# Patient Record
Sex: Female | Born: 1980
Health system: Southern US, Community
[De-identification: ages and names within clinical notes are randomized; demographics above are authoritative.]

## PROBLEM LIST (undated history)

## (undated) DIAGNOSIS — D649 Anemia, unspecified: Secondary | ICD-10-CM

## (undated) HISTORY — PX: CAROTID BODY TUMOR EXCISION: SHX5156

---

## 2011-07-19 DIAGNOSIS — Z789 Other specified health status: Secondary | ICD-10-CM | POA: Insufficient documentation

## 2011-07-27 DIAGNOSIS — E559 Vitamin D deficiency, unspecified: Secondary | ICD-10-CM | POA: Insufficient documentation

## 2014-10-14 DIAGNOSIS — Z2821 Immunization not carried out because of patient refusal: Secondary | ICD-10-CM | POA: Insufficient documentation

## 2015-08-31 DIAGNOSIS — H61102 Unspecified noninfective disorders of pinna, left ear: Secondary | ICD-10-CM | POA: Insufficient documentation

## 2015-09-11 DIAGNOSIS — D110 Benign neoplasm of parotid gland: Secondary | ICD-10-CM | POA: Insufficient documentation

## 2016-02-29 ENCOUNTER — Ambulatory Visit (INDEPENDENT_AMBULATORY_CARE_PROVIDER_SITE_OTHER): Payer: 59 | Admitting: Family Medicine

## 2016-02-29 ENCOUNTER — Encounter: Payer: Self-pay | Admitting: Family Medicine

## 2016-02-29 DIAGNOSIS — Z3689 Encounter for other specified antenatal screening: Secondary | ICD-10-CM | POA: Diagnosis not present

## 2016-02-29 DIAGNOSIS — Z113 Encounter for screening for infections with a predominantly sexual mode of transmission: Secondary | ICD-10-CM | POA: Diagnosis not present

## 2016-02-29 DIAGNOSIS — Z3481 Encounter for supervision of other normal pregnancy, first trimester: Secondary | ICD-10-CM | POA: Diagnosis not present

## 2016-02-29 DIAGNOSIS — Z3480 Encounter for supervision of other normal pregnancy, unspecified trimester: Secondary | ICD-10-CM | POA: Insufficient documentation

## 2016-02-29 NOTE — Progress Notes (Signed)
  Subjective:    Christine Bryant is a [id number] [redacted]w[redacted]d being seen today for her first obstetrical visit.  Her obstetrical history is insignificant. Patient does intend to breast feed. Pregnancy history fully reviewed.  Spontaneous labors with both pregnancies. Proven pelvis to 10#1oz. Minimal tear, no shoulder dystocia. No history of GDM, GHTN, preeclampsia, PP hemorrage.  Patient reports no complaints.  Vitals:   02/29/16 0911 02/29/16 0915  BP: 127/77   Pulse: 75   Weight: 212 lb (96.2 kg)   Height:  5\' 10"  (1.778 m)    HISTORY: OB History  Gravida Para Term Preterm AB Living  3 2 2     2   SAB TAB Ectopic Multiple Live Births          2    # Outcome Date GA Lbr Len/2nd Weight Sex Delivery Anes PTL Lv  3 Current           2 Term 12/19/14 [redacted]w[redacted]d  10 lb 1 oz (4.564 kg) F Vag-Spont  N   1 Term 07/12/13 [redacted]w[redacted]d  8 lb 15 oz (4.054 kg) F Vag-Spont  Y      No past medical history on file. No past surgical history on file. No family history on file.   Exam    Uterus:     Pelvic Exam:    Perineum: No Hemorrhoids, Normal Perineum   Vulva: Bartholin's, Urethra, Skene's normal   Vagina:  normal mucosa   Cervix: multiparous appearance   Adnexa: normal adnexa and no mass, fullness, tenderness   Bony Pelvis: gynecoid  System:     Skin: normal coloration and turgor, no rashes    Neurologic: gait normal; reflexes normal and symmetric   Extremities: normal strength, tone, and muscle mass   HEENT PERRLA and extra ocular movement intact   Mouth/Teeth mucous membranes moist, pharynx normal without lesions   Neck supple and no masses   Cardiovascular: regular rate and rhythm, no murmurs or gallops   Respiratory:  appears well, vitals normal, no respiratory distress, acyanotic, normal RR, ear and throat exam is normal, neck free of mass or lymphadenopathy, chest clear, no wheezing, crepitations, rhonchi, normal symmetric air entry   Abdomen: soft, non-tender; bowel sounds normal; no masses,   no organomegaly   Urinary: urethral meatus normal      Assessment:    Pregnancy: [id number] Patient Active Problem List   Diagnosis Date Noted  . Supervision of other normal pregnancy, antepartum 02/29/2016        Plan:     Initial labs drawn. Prenatal vitamins. Problem list reviewed and updated. Genetic Screening discussed - declined.  Ultrasound discussed; fetal survey: requested.  Follow up in 4 weeks. 50% of 30 min visit spent on counseling and coordination of care.     Loma Boston JEHIEL 02/29/2016

## 2016-02-29 NOTE — Progress Notes (Signed)
Bedside ultrasound reveal 8-2 weeks singleton. CRL (2.01) Fetal heart rate 152 bpm.

## 2016-03-01 ENCOUNTER — Encounter: Payer: Self-pay | Admitting: Family Medicine

## 2016-03-01 DIAGNOSIS — Z6791 Unspecified blood type, Rh negative: Secondary | ICD-10-CM

## 2016-03-01 DIAGNOSIS — O26899 Other specified pregnancy related conditions, unspecified trimester: Secondary | ICD-10-CM | POA: Insufficient documentation

## 2016-03-01 LAB — OBSTETRIC PANEL
Antibody Screen: NEGATIVE
BASOS PCT: 0 %
Basophils Absolute: 0 cells/uL (ref 0–200)
Eosinophils Absolute: 85 cells/uL (ref 15–500)
Eosinophils Relative: 1 %
HCT: 39.1 % (ref 35.0–45.0)
Hemoglobin: 13.1 g/dL (ref 11.7–15.5)
Hepatitis B Surface Ag: NEGATIVE
LYMPHS PCT: 24 %
Lymphs Abs: 2040 cells/uL (ref 850–3900)
MCH: 28.1 pg (ref 27.0–33.0)
MCHC: 33.5 g/dL (ref 32.0–36.0)
MCV: 83.9 fL (ref 80.0–100.0)
MONOS PCT: 6 %
MPV: 9.6 fL (ref 7.5–12.5)
Monocytes Absolute: 510 cells/uL (ref 200–950)
NEUTROS ABS: 5865 {cells}/uL (ref 1500–7800)
Neutrophils Relative %: 69 %
PLATELETS: 289 10*3/uL (ref 140–400)
RBC: 4.66 MIL/uL (ref 3.80–5.10)
RDW: 14.1 % (ref 11.0–15.0)
RH TYPE: NEGATIVE
Rubella: 3.56 Index — ABNORMAL HIGH (ref <0.90)
WBC: 8.5 10*3/uL (ref 3.8–10.8)

## 2016-03-01 LAB — HIV ANTIBODY (ROUTINE TESTING W REFLEX): HIV: NONREACTIVE

## 2016-03-05 LAB — GC/CHLAMYDIA PROBE AMP (~~LOC~~) NOT AT ARMC
Chlamydia: NEGATIVE
NEISSERIA GONORRHEA: NEGATIVE

## 2016-04-03 ENCOUNTER — Ambulatory Visit (INDEPENDENT_AMBULATORY_CARE_PROVIDER_SITE_OTHER): Payer: 59 | Admitting: Family Medicine

## 2016-04-03 VITALS — BP 116/76 | HR 86 | Wt 211.0 lb

## 2016-04-03 DIAGNOSIS — Z3480 Encounter for supervision of other normal pregnancy, unspecified trimester: Secondary | ICD-10-CM

## 2016-04-03 DIAGNOSIS — Z3482 Encounter for supervision of other normal pregnancy, second trimester: Secondary | ICD-10-CM

## 2016-04-03 NOTE — Progress Notes (Signed)
   PRENATAL VISIT NOTE  Subjective:  Christine Bryant is a 35 y.o. G3P2002 at [redacted]w[redacted]d being seen today for ongoing prenatal care.  She is currently monitored for the following issues for this low-risk pregnancy and has Supervision of other normal pregnancy, antepartum and Rh negative status during pregnancy on her problem list.  Patient reports no complaints.  Contractions: Not present. Vag. Bleeding: None.  Movement: Absent. Denies leaking of fluid.   The following portions of the patient's history were reviewed and updated as appropriate: allergies, current medications, past family history, past medical history, past social history, past surgical history and problem list. Problem list updated.  Objective:   Vitals:   04/03/16 1445  BP: 116/76  Pulse: 86  Weight: 211 lb (95.7 kg)    Fetal Status:   Fundal Height: 159 cm Movement: Absent     General:  Alert, oriented and cooperative. Patient is in no acute distress.  Skin: Skin is warm and dry. No rash noted.   Cardiovascular: Normal heart rate noted  Respiratory: Normal respiratory effort, no problems with respiration noted  Abdomen: Soft, gravid, appropriate for gestational age. Pain/Pressure: Present     Pelvic:  Cervical exam deferred        Extremities: Normal range of motion.  Edema: None  Mental Status: Normal mood and affect. Normal behavior. Normal judgment and thought content.   Assessment and Plan:  Pregnancy: G3P2002 at [redacted]w[redacted]d  1. Supervision of other normal pregnancy, antepartum FHT normal. No other concerns.   Preterm labor symptoms and general obstetric precautions including but not limited to vaginal bleeding, contractions, leaking of fluid and fetal movement were reviewed in detail with the patient. Please refer to After Visit Summary for other counseling recommendations.  Return in about 4 weeks (around 05/01/2016) for OB f/u.   Truett Mainland, DO

## 2016-05-02 ENCOUNTER — Ambulatory Visit (INDEPENDENT_AMBULATORY_CARE_PROVIDER_SITE_OTHER): Payer: 59 | Admitting: Family Medicine

## 2016-05-02 VITALS — BP 119/70 | HR 81 | Wt 213.0 lb

## 2016-05-02 DIAGNOSIS — Z3480 Encounter for supervision of other normal pregnancy, unspecified trimester: Secondary | ICD-10-CM

## 2016-05-02 NOTE — Progress Notes (Signed)
   PRENATAL VISIT NOTE  Subjective:  Christine Bryant is a 35 y.o. G3P2002 at [redacted]w[redacted]d being seen today for ongoing prenatal care.  She is currently monitored for the following issues for this low-risk pregnancy and has Supervision of other normal pregnancy, antepartum and Rh negative status during pregnancy on her problem list.  Patient reports no complaints.  Contractions: Not present. Vag. Bleeding: None.  Movement: Present. Denies leaking of fluid.   The following portions of the patient's history were reviewed and updated as appropriate: allergies, current medications, past family history, past medical history, past social history, past surgical history and problem list. Problem list updated.  Objective:   Vitals:   05/02/16 1001  BP: 119/70  Pulse: 81  Weight: 213 lb (96.6 kg)    Fetal Status: Fetal Heart Rate (bpm): 156   Movement: Present     General:  Alert, oriented and cooperative. Patient is in no acute distress.  Skin: Skin is warm and dry. No rash noted.   Cardiovascular: Normal heart rate noted  Respiratory: Normal respiratory effort, no problems with respiration noted  Abdomen: Soft, gravid, appropriate for gestational age. Pain/Pressure: Present     Pelvic:  Cervical exam deferred        Extremities: Normal range of motion.  Edema: None  Mental Status: Normal mood and affect. Normal behavior. Normal judgment and thought content.   Assessment and Plan:  Pregnancy: G3P2002 at [redacted]w[redacted]d  1. Supervision of other normal pregnancy, antepartum FHT and FH normal - [place] MFM OB COMP + 14 WK; Future  Preterm labor symptoms and general obstetric precautions including but not limited to vaginal bleeding, contractions, leaking of fluid and fetal movement were reviewed in detail with the patient. Please refer to After Visit Summary for other counseling recommendations.  Return in about 4 weeks (around 05/30/2016).   Truett Mainland, DO

## 2016-05-13 NOTE — L&D Delivery Note (Signed)
Delivery Note At 7:04 AM a viable female was delivered via  (Presentation:vertex ; LOA ).  APGAR: 8,9 ; weight  .   Placenta status:spont , shultz.  Cord: 3vc with the following complications:none .  Cord pH: n/a  Anesthesia: local  Episiotomy:  none Lacerations:  2nd Suture Repair: 2.0 vicryl Est. Blood Loss (mL):    Mom to postpartum.  Baby to Couplet care / Skin to Skin.  Koren Shiver 10/07/2016, 7:24 AM

## 2016-05-14 ENCOUNTER — Encounter (HOSPITAL_COMMUNITY): Payer: Self-pay

## 2016-05-14 ENCOUNTER — Other Ambulatory Visit: Payer: Self-pay | Admitting: Family Medicine

## 2016-05-14 ENCOUNTER — Ambulatory Visit (HOSPITAL_COMMUNITY)
Admission: RE | Admit: 2016-05-14 | Discharge: 2016-05-14 | Disposition: A | Discharge status: Home / Self Care | Payer: 59 | Source: Ambulatory Visit | Attending: Family Medicine | Admitting: Family Medicine

## 2016-05-14 DIAGNOSIS — Z363 Encounter for antenatal screening for malformations: Secondary | ICD-10-CM

## 2016-05-14 DIAGNOSIS — Z3A19 19 weeks gestation of pregnancy: Secondary | ICD-10-CM

## 2016-05-14 DIAGNOSIS — Z3480 Encounter for supervision of other normal pregnancy, unspecified trimester: Secondary | ICD-10-CM

## 2016-05-14 DIAGNOSIS — O09522 Supervision of elderly multigravida, second trimester: Secondary | ICD-10-CM | POA: Diagnosis not present

## 2016-05-14 HISTORY — DX: Anemia, unspecified: D64.9

## 2016-06-05 ENCOUNTER — Ambulatory Visit (INDEPENDENT_AMBULATORY_CARE_PROVIDER_SITE_OTHER): Payer: 59 | Admitting: Family Medicine

## 2016-06-05 VITALS — BP 127/71 | HR 78 | Wt 221.0 lb

## 2016-06-05 DIAGNOSIS — Z3480 Encounter for supervision of other normal pregnancy, unspecified trimester: Secondary | ICD-10-CM

## 2016-06-05 DIAGNOSIS — Z3482 Encounter for supervision of other normal pregnancy, second trimester: Secondary | ICD-10-CM

## 2016-06-05 NOTE — Progress Notes (Signed)
   PRENATAL VISIT NOTE  Subjective:  Christine Bryant is a 36 y.o. G3P2002 at [redacted]w[redacted]d being seen today for ongoing prenatal care.  She is currently monitored for the following issues for this low-risk pregnancy and has Supervision of other normal pregnancy, antepartum and Rh negative status during pregnancy on her problem list.  Patient reports heartburn.  Contractions: Not present. Vag. Bleeding: None.  Movement: Present. Denies leaking of fluid.   The following portions of the patient's history were reviewed and updated as appropriate: allergies, current medications, past family history, past medical history, past social history, past surgical history and problem list. Problem list updated.  Objective:   Vitals:   06/05/16 1516  BP: 127/71  Pulse: 78  Weight: 221 lb (100.2 kg)    Fetal Status: Fetal Heart Rate (bpm): 147   Movement: Present     General:  Alert, oriented and cooperative. Patient is in no acute distress.  Skin: Skin is warm and dry. No rash noted.   Cardiovascular: Normal heart rate noted  Respiratory: Normal respiratory effort, no problems with respiration noted  Abdomen: Soft, gravid, appropriate for gestational age. Pain/Pressure: Present     Pelvic:  Cervical exam deferred        Extremities: Normal range of motion.  Edema: None  Mental Status: Normal mood and affect. Normal behavior. Normal judgment and thought content.   Assessment and Plan:  Pregnancy: G3P2002 at [redacted]w[redacted]d  1. Supervision of other normal pregnancy, antepartum FHT and FH normal. [place] normal. Fasting 2hr GTT and labs next visit   Preterm labor symptoms and general obstetric precautions including but not limited to vaginal bleeding, contractions, leaking of fluid and fetal movement were reviewed in detail with the patient. Please refer to After Visit Summary for other counseling recommendations.  No Follow-up on file.   Truett Mainland, DO

## 2016-07-05 ENCOUNTER — Ambulatory Visit (INDEPENDENT_AMBULATORY_CARE_PROVIDER_SITE_OTHER): Payer: 59 | Admitting: Obstetrics & Gynecology

## 2016-07-05 VITALS — BP 121/67 | HR 71 | Wt 226.0 lb

## 2016-07-05 DIAGNOSIS — O360930 Maternal care for other rhesus isoimmunization, third trimester, not applicable or unspecified: Secondary | ICD-10-CM | POA: Diagnosis not present

## 2016-07-05 DIAGNOSIS — Z3490 Encounter for supervision of normal pregnancy, unspecified, unspecified trimester: Secondary | ICD-10-CM

## 2016-07-05 DIAGNOSIS — O26892 Other specified pregnancy related conditions, second trimester: Secondary | ICD-10-CM

## 2016-07-05 DIAGNOSIS — Z3493 Encounter for supervision of normal pregnancy, unspecified, third trimester: Secondary | ICD-10-CM | POA: Diagnosis not present

## 2016-07-05 DIAGNOSIS — Z3480 Encounter for supervision of other normal pregnancy, unspecified trimester: Secondary | ICD-10-CM

## 2016-07-05 DIAGNOSIS — Z6791 Unspecified blood type, Rh negative: Secondary | ICD-10-CM

## 2016-07-05 MED ORDER — RHO D IMMUNE GLOBULIN 1500 UNIT/2ML IJ SOSY
300.0000 ug | PREFILLED_SYRINGE | Freq: Once | INTRAMUSCULAR | Status: AC
  Administered 2016-07-05: 300 ug via INTRAMUSCULAR

## 2016-07-05 NOTE — Progress Notes (Signed)
   PRENATAL VISIT NOTE  Subjective:  Christine Bryant is a 36 y.o. G3P2002 at [redacted]w[redacted]d being seen today for ongoing prenatal care.  She is currently monitored for the following issues for this low-risk pregnancy and has Supervision of other normal pregnancy, antepartum and Rh negative status during pregnancy on her problem list.  Patient reports back pain adn occ SOB.  She also reports decreased sensation of movement but has good [place].  Started those yesterday adn is reassurred..  Contractions: Not present. Vag. Bleeding: None.  Movement: Present. Denies leaking of fluid.   The following portions of the patient's history were reviewed and updated as appropriate: allergies, current medications, past family history, past medical history, past social history, past surgical history and problem list. Problem list updated.  Objective:   Vitals:   07/05/16 0833  BP: 121/67  Pulse: 71  Weight: 226 lb (102.5 kg)    Fetal Status: Fetal Heart Rate (bpm): 155   Movement: Present     General:  Alert, oriented and cooperative. Patient is in no acute distress.  Skin: Skin is warm and dry. No rash noted.   Cardiovascular: Normal heart rate noted  Respiratory: Normal respiratory effort, no problems with respiration noted  Abdomen: Soft, gravid, appropriate for gestational age. Pain/Pressure: Present     Pelvic:  Cervical exam deferred        Extremities: Normal range of motion.  Edema: None  Mental Status: Normal mood and affect. Normal behavior. Normal judgment and thought content.   Assessment and Plan:  Pregnancy: G3P2002 at [redacted]w[redacted]d  1. Prenatal care, antepartum - CBC - RPR - Glucose Tolerance, 2 Hours w/1 Hour - Rh Type - HIV antibody (with reflex)  2. Supervision of other normal pregnancy, antepartum  3. Rh negative status during pregnancy in second trimester Rhogam today  Preterm labor symptoms and general obstetric precautions including but not limited to vaginal bleeding, contractions,  leaking of fluid and fetal movement were reviewed in detail with the patient. Please refer to After Visit Summary for other counseling recommendations.  Return in about 2 weeks (around 07/19/2016).   Lavonia Drafts, MD

## 2016-07-05 NOTE — Patient Instructions (Signed)
Introduction Patient Name: ________________________________________________ Patient Due Date: ____________________ What is a fetal movement count? A fetal movement count is the number of times that you feel your baby move during a certain amount of time. This may also be called a fetal kick count. A fetal movement count is recommended for every pregnant woman. You may be asked to start counting fetal movements as early as week 28 of your pregnancy. Pay attention to when your baby is most active. You may notice your baby's sleep and wake cycles. You may also notice things that make your baby move more. You should do a fetal movement count:  When your baby is normally most active.  At the same time each day. A good time to count movements is while you are resting, after having something to eat and drink. How do I count fetal movements? 1. Find a quiet, comfortable area. Sit, or lie down on your side. 2. Write down the date, the start time and stop time, and the number of movements that you felt between those two times. Take this information with you to your health care visits. 3. For 2 hours, count kicks, flutters, swishes, rolls, and jabs. You should feel at least 10 movements during 2 hours. 4. You may stop counting after you have felt 10 movements. 5. If you do not feel 10 movements in 2 hours, have something to eat and drink. Then, keep resting and counting for 1 hour. If you feel at least 4 movements during that hour, you may stop counting. Contact a health care provider if:  You feel fewer than 4 movements in 2 hours.  Your baby is not moving like he or she usually does. Date: ____________ Start time: ____________ Stop time: ____________ Movements: ____________ Date: ____________ Start time: ____________ Stop time: ____________ Movements: ____________ Date: ____________ Start time: ____________ Stop time: ____________ Movements: ____________ Date: ____________ Start time: ____________  Stop time: ____________ Movements: ____________ Date: ____________ Start time: ____________ Stop time: ____________ Movements: ____________ Date: ____________ Start time: ____________ Stop time: ____________ Movements: ____________ Date: ____________ Start time: ____________ Stop time: ____________ Movements: ____________ Date: ____________ Start time: ____________ Stop time: ____________ Movements: ____________ Date: ____________ Start time: ____________ Stop time: ____________ Movements: ____________ This information is not intended to replace advice given to you by your health care provider. Make sure you discuss any questions you have with your health care provider. Document Released: 05/29/2006 Document Revised: 12/27/2015 Document Reviewed: 06/08/2015 [employer] Interactive Patient Education  2017 [employer]  

## 2016-07-06 LAB — CBC
Hematocrit: 35.3 % (ref 34.0–46.6)
Hemoglobin: 11.7 g/dL (ref 11.1–15.9)
MCH: 27.9 pg (ref 26.6–33.0)
MCHC: 33.1 g/dL (ref 31.5–35.7)
MCV: 84 fL (ref 79–97)
PLATELETS: 268 10*3/uL (ref 150–379)
RBC: 4.19 x10E6/uL (ref 3.77–5.28)
RDW: 13.8 % (ref 12.3–15.4)
WBC: 8.3 10*3/uL (ref 3.4–10.8)

## 2016-07-06 LAB — GLUCOSE TOLERANCE, 2 HOURS W/ 1HR
GLUCOSE, FASTING: 89 mg/dL (ref 65–91)
Glucose, 1 hour: 142 mg/dL (ref 65–179)
Glucose, 2 hour: 90 mg/dL (ref 65–152)

## 2016-07-06 LAB — HIV ANTIBODY (ROUTINE TESTING W REFLEX): HIV Screen 4th Generation wRfx: NONREACTIVE

## 2016-07-06 LAB — RH TYPE: Rh Factor: NEGATIVE

## 2016-07-06 LAB — RPR: RPR: NONREACTIVE

## 2016-07-24 ENCOUNTER — Ambulatory Visit (INDEPENDENT_AMBULATORY_CARE_PROVIDER_SITE_OTHER): Payer: 59 | Admitting: Obstetrics & Gynecology

## 2016-07-24 VITALS — BP 112/70 | HR 87 | Wt 233.0 lb

## 2016-07-24 DIAGNOSIS — Z6791 Unspecified blood type, Rh negative: Secondary | ICD-10-CM

## 2016-07-24 DIAGNOSIS — O26893 Other specified pregnancy related conditions, third trimester: Secondary | ICD-10-CM

## 2016-07-24 DIAGNOSIS — Z3483 Encounter for supervision of other normal pregnancy, third trimester: Secondary | ICD-10-CM

## 2016-07-24 DIAGNOSIS — Z3480 Encounter for supervision of other normal pregnancy, unspecified trimester: Secondary | ICD-10-CM

## 2016-07-24 NOTE — Patient Instructions (Signed)
Third Trimester of Pregnancy The third trimester is from week 28 through week 40 (months 7 through 9). The third trimester is a time when the unborn baby (fetus) is growing rapidly. At the end of the ninth month, the fetus is about 20 inches in length and weighs 6-10 pounds. Body changes during your third trimester Your body will continue to go through many changes during pregnancy. The changes vary from woman to woman. During the third trimester:  Your weight will continue to increase. You can expect to gain 25-35 pounds (11-16 kg) by the end of the pregnancy.  You may begin to get stretch marks on your hips, abdomen, and breasts.  You may urinate more often because the fetus is moving lower into your pelvis and pressing on your bladder.  You may develop or continue to have heartburn. This is caused by increased hormones that slow down muscles in the digestive tract.  You may develop or continue to have constipation because increased hormones slow digestion and cause the muscles that push waste through your intestines to relax.  You may develop hemorrhoids. These are swollen veins (varicose veins) in the rectum that can itch or be painful.  You may develop swollen, bulging veins (varicose veins) in your legs.  You may have increased body aches in the pelvis, back, or thighs. This is due to weight gain and increased hormones that are relaxing your joints.  You may have changes in your hair. These can include thickening of your hair, rapid growth, and changes in texture. Some women also have hair loss during or after pregnancy, or hair that feels dry or thin. Your hair will most likely return to normal after your baby is born.  Your breasts will continue to grow and they will continue to become tender. A yellow fluid (colostrum) may leak from your breasts. This is the first milk you are producing for your baby.  Your belly button may stick out.  You may notice more swelling in your hands,  face, or ankles.  You may have increased tingling or numbness in your hands, arms, and legs. The skin on your belly may also feel numb.  You may feel short of breath because of your expanding uterus.  You may have more problems sleeping. This can be caused by the size of your belly, increased need to urinate, and an increase in your body's metabolism.  You may notice the fetus "dropping," or moving lower in your abdomen (lightening).  You may have increased vaginal discharge.  You may notice your joints feel loose and you may have pain around your pelvic bone.  What to expect at prenatal visits You will have prenatal exams every 2 weeks until week 36. Then you will have weekly prenatal exams. During a routine prenatal visit:  You will be weighed to make sure you and the baby are growing normally.  Your blood pressure will be taken.  Your abdomen will be measured to track your baby's growth.  The fetal heartbeat will be listened to.  Any test results from the previous visit will be discussed.  You may have a cervical check near your due date to see if your cervix has softened or thinned (effaced).  You will be tested for Group B streptococcus. This happens between 35 and 37 weeks.  Your health care provider may ask you:  What your birth plan is.  How you are feeling.  If you are feeling the baby move.  If you have had   any abnormal symptoms, such as leaking fluid, bleeding, severe headaches, or abdominal cramping.  If you are using any tobacco products, including cigarettes, chewing tobacco, and electronic cigarettes.  If you have any questions.  Other tests or screenings that may be performed during your third trimester include:  Blood tests that check for low iron levels (anemia).  Fetal testing to check the health, activity level, and growth of the fetus. Testing is done if you have certain medical conditions or if there are problems during the  pregnancy.  Nonstress test (NST). This test checks the health of your baby to make sure there are no signs of problems, such as the baby not getting enough oxygen. During this test, a belt is placed around your belly. The baby is made to move, and its heart rate is monitored during movement.  What is false labor? False labor is a condition in which you feel small, irregular tightenings of the muscles in the womb (contractions) that usually go away with rest, changing position, or drinking water. These are called Braxton Hicks contractions. Contractions may last for hours, days, or even weeks before true labor sets in. If contractions come at regular intervals, become more frequent, increase in intensity, or become painful, you should see your health care provider. What are the signs of labor?  Abdominal cramps.  Regular contractions that start at 10 minutes apart and become stronger and more frequent with time.  Contractions that start on the top of the uterus and spread down to the lower abdomen and back.  Increased pelvic pressure and dull back pain.  A watery or bloody mucus discharge that comes from the vagina.  Leaking of amniotic fluid. This is also known as your "water breaking." It could be a slow trickle or a gush. Let your health care provider know if it has a color or strange odor. If you have any of these signs, call your health care provider right away, even if it is before your due date. Follow these instructions at home: Medicines  Follow your health care provider's instructions regarding medicine use. Specific medicines may be either safe or unsafe to take during pregnancy.  Take a prenatal vitamin that contains at least 600 micrograms (mcg) of folic acid.  If you develop constipation, try taking a stool softener if your health care provider approves. Eating and drinking  Eat a balanced diet that includes fresh fruits and vegetables, whole grains, good sources of protein  such as meat, eggs, or tofu, and low-fat dairy. Your health care provider will help you determine the amount of weight gain that is right for you.  Avoid raw meat and uncooked cheese. These carry germs that can cause birth defects in the baby.  If you have low calcium intake from food, talk to your health care provider about whether you should take a daily calcium supplement.  Eat four or five small meals rather than three large meals a day.  Limit foods that are high in fat and processed sugars, such as fried and sweet foods.  To prevent constipation: ? Drink enough fluid to keep your urine clear or pale yellow. ? Eat foods that are high in fiber, such as fresh fruits and vegetables, whole grains, and beans. Activity  Exercise only as directed by your health care provider. Most women can continue their usual exercise routine during pregnancy. Try to exercise for 30 minutes at least 5 days a week. Stop exercising if you experience uterine contractions.  Avoid heavy   lifting.  Do not exercise in extreme heat or humidity, or at high altitudes.  Wear low-heel, comfortable shoes.  Practice good posture.  You may continue to have sex unless your health care provider tells you otherwise. Relieving pain and discomfort  Take frequent breaks and rest with your legs elevated if you have leg cramps or low back pain.  Take warm sitz baths to soothe any pain or discomfort caused by hemorrhoids. Use hemorrhoid cream if your health care provider approves.  Wear a good support bra to prevent discomfort from breast tenderness.  If you develop varicose veins: ? Wear support pantyhose or compression stockings as told by your healthcare provider. ? Elevate your feet for 15 minutes, 3-4 times a day. Prenatal care  Write down your questions. Take them to your prenatal visits.  Keep all your prenatal visits as told by your health care provider. This is important. Safety  Wear your seat belt at  all times when driving.  Make a list of emergency phone numbers, including numbers for family, friends, the hospital, and police and fire departments. General instructions  Avoid cat litter boxes and soil used by cats. These carry germs that can cause birth defects in the baby. If you have a cat, ask someone to clean the litter box for you.  Do not travel far distances unless it is absolutely necessary and only with the approval of your health care provider.  Do not use hot tubs, steam rooms, or saunas.  Do not drink alcohol.  Do not use any products that contain nicotine or tobacco, such as cigarettes and e-cigarettes. If you need help quitting, ask your health care provider.  Do not use any medicinal herbs or unprescribed drugs. These chemicals affect the formation and growth of the baby.  Do not douche or use tampons or scented sanitary pads.  Do not cross your legs for long periods of time.  To prepare for the arrival of your baby: ? Take prenatal classes to understand, practice, and ask questions about labor and delivery. ? Make a trial run to the hospital. ? Visit the hospital and tour the maternity area. ? Arrange for maternity or paternity leave through employers. ? Arrange for family and friends to take care of pets while you are in the hospital. ? Purchase a rear-facing car seat and make sure you know how to install it in your car. ? Pack your hospital bag. ? Prepare the baby's nursery. Make sure to remove all pillows and stuffed animals from the baby's crib to prevent suffocation.  Visit your dentist if you have not gone during your pregnancy. Use a soft toothbrush to brush your teeth and be gentle when you floss. Contact a health care provider if:  You are unsure if you are in labor or if your water has broken.  You become dizzy.  You have mild pelvic cramps, pelvic pressure, or nagging pain in your abdominal area.  You have lower back pain.  You have persistent  nausea, vomiting, or diarrhea.  You have an unusual or bad smelling vaginal discharge.  You have pain when you urinate. Get help right away if:  Your water breaks before 37 weeks.  You have regular contractions less than 5 minutes apart before 37 weeks.  You have a fever.  You are leaking fluid from your vagina.  You have spotting or bleeding from your vagina.  You have severe abdominal pain or cramping.  You have rapid weight loss or weight gain.    You have shortness of breath with chest pain.  You notice sudden or extreme swelling of your face, hands, ankles, feet, or legs.  Your baby makes fewer than 10 movements in 2 hours.  You have severe headaches that do not go away when you take medicine.  You have vision changes. Summary  The third trimester is from week 28 through week 40, months 7 through 9. The third trimester is a time when the unborn baby (fetus) is growing rapidly.  During the third trimester, your discomfort may increase as you and your baby continue to gain weight. You may have abdominal, leg, and back pain, sleeping problems, and an increased need to urinate.  During the third trimester your breasts will keep growing and they will continue to become tender. A yellow fluid (colostrum) may leak from your breasts. This is the first milk you are producing for your baby.  False labor is a condition in which you feel small, irregular tightenings of the muscles in the womb (contractions) that eventually go away. These are called Braxton Hicks contractions. Contractions may last for hours, days, or even weeks before true labor sets in.  Signs of labor can include: abdominal cramps; regular contractions that start at 10 minutes apart and become stronger and more frequent with time; watery or bloody mucus discharge that comes from the vagina; increased pelvic pressure and dull back pain; and leaking of amniotic fluid. This information is not intended to replace advice  given to you by your health care provider. Make sure you discuss any questions you have with your health care provider. Document Released: 04/23/2001 Document Revised: 10/05/2015 Document Reviewed: 06/30/2012 Elsevier Interactive Patient Education  2017 [employer]  

## 2016-07-24 NOTE — Progress Notes (Signed)
   PRENATAL VISIT NOTE  Subjective:  Christine Bryant is a 36 y.o. G3P2002 at [redacted]w[redacted]d being seen today for ongoing prenatal care.  She is currently monitored for the following issues for this low-risk pregnancy and has Supervision of other normal pregnancy, antepartum and Rh negative status during pregnancy on her problem list.  Patient reports no complaints.  Contractions: Not present. Vag. Bleeding: None.  Movement: Present. Denies leaking of fluid.   The following portions of the patient's history were reviewed and updated as appropriate: allergies, current medications, past family history, past medical history, past social history, past surgical history and problem list. Problem list updated.  Objective:   Vitals:   07/24/16 1419  BP: 112/70  Pulse: 87  Weight: 233 lb (105.7 kg)    Fetal Status: Fetal Heart Rate (bpm): 146   Movement: Present     General:  Alert, oriented and cooperative. Patient is in no acute distress.  Skin: Skin is warm and dry. No rash noted.   Cardiovascular: Normal heart rate noted  Respiratory: Normal respiratory effort, no problems with respiration noted  Abdomen: Soft, gravid, appropriate for gestational age. Pain/Pressure: Present     Pelvic:  Cervical exam deferred        Extremities: Normal range of motion.  Edema: None  Mental Status: Normal mood and affect. Normal behavior. Normal judgment and thought content.   Assessment and Plan:  Pregnancy: G3P2002 at [redacted]w[redacted]d  1. Supervision of other normal pregnancy, antepartum  2. Rh negative status during pregnancy in third trimester S/p Rhogam  Preterm labor symptoms and general obstetric precautions including but not limited to vaginal bleeding, contractions, leaking of fluid and fetal movement were reviewed in detail with the patient. Please refer to After Visit Summary for other counseling recommendations.  Return in about 2 weeks (around 08/07/2016).   Lavonia Drafts, MD

## 2016-08-08 ENCOUNTER — Ambulatory Visit (INDEPENDENT_AMBULATORY_CARE_PROVIDER_SITE_OTHER): Payer: 59 | Admitting: Family Medicine

## 2016-08-08 VITALS — BP 123/80 | HR 92 | Wt 234.0 lb

## 2016-08-08 DIAGNOSIS — O26843 Uterine size-date discrepancy, third trimester: Secondary | ICD-10-CM

## 2016-08-08 DIAGNOSIS — Z3480 Encounter for supervision of other normal pregnancy, unspecified trimester: Secondary | ICD-10-CM

## 2016-08-08 NOTE — Progress Notes (Signed)
   PRENATAL VISIT NOTE  Subjective:  Christine Bryant is a 36 y.o. G3P2002 at [redacted]w[redacted]d being seen today for ongoing prenatal care.  She is currently monitored for the following issues for this low-risk pregnancy and has Supervision of other normal pregnancy, antepartum and Rh negative status during pregnancy on her problem list.  Patient reports occasional contractions.  Contractions: Not present. Vag. Bleeding: None.  Movement: Present. Denies leaking of fluid.   The following portions of the patient's history were reviewed and updated as appropriate: allergies, current medications, past family history, past medical history, past social history, past surgical history and problem list. Problem list updated.  Objective:   Vitals:   08/08/16 0926  BP: 123/80  Pulse: 92  Weight: 234 lb (106.1 kg)    Fetal Status: Fetal Heart Rate (bpm): 141   Movement: Present     General:  Alert, oriented and cooperative. Patient is in no acute distress.  Skin: Skin is warm and dry. No rash noted.   Cardiovascular: Normal heart rate noted  Respiratory: Normal respiratory effort, no problems with respiration noted  Abdomen: Soft, gravid, appropriate for gestational age. Pain/Pressure: Present     Pelvic:  Cervical exam deferred        Extremities: Normal range of motion.  Edema: None  Mental Status: Normal mood and affect. Normal behavior. Normal judgment and thought content.   Assessment and Plan:  Pregnancy: G3P2002 at [redacted]w[redacted]d  1. Supervision of other normal pregnancy, antepartum FHT normal - CULTURE, URINE COMPREHENSIVE  2. Size of fetus inconsistent with dates in third trimester Will get [place] to evaluate fetal size and to r/o poly   Preterm labor symptoms and general obstetric precautions including but not limited to vaginal bleeding, contractions, leaking of fluid and fetal movement were reviewed in detail with the patient. Please refer to After Visit Summary for other counseling recommendations.    No Follow-up on file.   Truett Mainland, DO

## 2016-08-08 NOTE — Progress Notes (Signed)
Patient complaining of increase in vaginal discharge. Patient states she has also had a bad cough. Kathrene Alu RNBSN

## 2016-08-12 LAB — CULTURE, URINE COMPREHENSIVE

## 2016-08-22 ENCOUNTER — Ambulatory Visit (INDEPENDENT_AMBULATORY_CARE_PROVIDER_SITE_OTHER): Payer: 59 | Admitting: Family Medicine

## 2016-08-22 VITALS — BP 109/74 | HR 84 | Wt 238.0 lb

## 2016-08-22 DIAGNOSIS — Z3480 Encounter for supervision of other normal pregnancy, unspecified trimester: Secondary | ICD-10-CM

## 2016-08-22 DIAGNOSIS — Z3483 Encounter for supervision of other normal pregnancy, third trimester: Secondary | ICD-10-CM

## 2016-08-22 DIAGNOSIS — O26843 Uterine size-date discrepancy, third trimester: Secondary | ICD-10-CM

## 2016-08-22 NOTE — Progress Notes (Signed)
   PRENATAL VISIT NOTE  Subjective:  Christine Bryant is a 36 y.o. G3P2002 at [redacted]w[redacted]d being seen today for ongoing prenatal care.  She is currently monitored for the following issues for this low-risk pregnancy and has Supervision of other normal pregnancy, antepartum and Rh negative status during pregnancy on her problem list.  Patient reports occasional contractions.  Contractions: Irritability. Vag. Bleeding: None.  Movement: Present. Denies leaking of fluid.   The following portions of the patient's history were reviewed and updated as appropriate: allergies, current medications, past family history, past medical history, past social history, past surgical history and problem list. Problem list updated.  Objective:   Vitals:   08/22/16 0830  BP: 109/74  Pulse: 84  Weight: 238 lb (108 kg)    Fetal Status: Fetal Heart Rate (bpm): 138   Movement: Present     General:  Alert, oriented and cooperative. Patient is in no acute distress.  Skin: Skin is warm and dry. No rash noted.   Cardiovascular: Normal heart rate noted  Respiratory: Normal respiratory effort, no problems with respiration noted  Abdomen: Soft, gravid, appropriate for gestational age. Pain/Pressure: Present     Pelvic:  Cervical exam deferred        Extremities: Normal range of motion.  Edema: None  Mental Status: Normal mood and affect. Normal behavior. Normal judgment and thought content.   Assessment and Plan:  Pregnancy: G3P2002 at [redacted]w[redacted]d  1. Supervision of other normal pregnancy, antepartum FHT normal  2. Size of fetus inconsistent with dates in third trimester Has [place] tomorrow.   Preterm labor symptoms and general obstetric precautions including but not limited to vaginal bleeding, contractions, leaking of fluid and fetal movement were reviewed in detail with the patient. Please refer to After Visit Summary for other counseling recommendations.  Return in about 2 weeks (around 09/05/2016) for LR OB  f/u.   Truett Mainland, DO

## 2016-08-23 ENCOUNTER — Ambulatory Visit (HOSPITAL_COMMUNITY)
Admission: RE | Admit: 2016-08-23 | Discharge: 2016-08-23 | Disposition: A | Discharge status: Home / Self Care | Payer: 59 | Source: Ambulatory Visit | Attending: Family Medicine | Admitting: Family Medicine

## 2016-08-23 DIAGNOSIS — O26843 Uterine size-date discrepancy, third trimester: Secondary | ICD-10-CM | POA: Diagnosis present

## 2016-08-23 DIAGNOSIS — O09523 Supervision of elderly multigravida, third trimester: Secondary | ICD-10-CM | POA: Insufficient documentation

## 2016-08-23 DIAGNOSIS — Z3A33 33 weeks gestation of pregnancy: Secondary | ICD-10-CM | POA: Diagnosis not present

## 2016-08-26 ENCOUNTER — Encounter: Payer: Self-pay | Admitting: Family Medicine

## 2016-09-05 ENCOUNTER — Ambulatory Visit (INDEPENDENT_AMBULATORY_CARE_PROVIDER_SITE_OTHER): Payer: 59 | Admitting: Family Medicine

## 2016-09-05 VITALS — BP 125/81 | HR 85 | Wt 240.0 lb

## 2016-09-05 DIAGNOSIS — O26893 Other specified pregnancy related conditions, third trimester: Secondary | ICD-10-CM

## 2016-09-05 DIAGNOSIS — Z3480 Encounter for supervision of other normal pregnancy, unspecified trimester: Secondary | ICD-10-CM

## 2016-09-05 DIAGNOSIS — O09893 Supervision of other high risk pregnancies, third trimester: Secondary | ICD-10-CM

## 2016-09-05 DIAGNOSIS — O26843 Uterine size-date discrepancy, third trimester: Secondary | ICD-10-CM

## 2016-09-05 DIAGNOSIS — Z3483 Encounter for supervision of other normal pregnancy, third trimester: Secondary | ICD-10-CM

## 2016-09-05 DIAGNOSIS — Z6791 Unspecified blood type, Rh negative: Secondary | ICD-10-CM

## 2016-09-05 NOTE — Progress Notes (Signed)
   PRENATAL VISIT NOTE  Subjective:  Christine Bryant is a 36 y.o. G3P2002 at [redacted]w[redacted]d being seen today for ongoing prenatal care.  She is currently monitored for the following issues for this low-risk pregnancy and has Supervision of other normal pregnancy, antepartum and Rh negative status during pregnancy on her problem list.  Patient reports no complaints.  Contractions: Not present. Vag. Bleeding: None.  Movement: Present. Denies leaking of fluid.   The following portions of the patient's history were reviewed and updated as appropriate: allergies, current medications, past family history, past medical history, past social history, past surgical history and problem list. Problem list updated.  Objective:   Vitals:   09/05/16 0831  BP: 125/81  Pulse: 85  Weight: 240 lb (108.9 kg)    Fetal Status: Fetal Heart Rate (bpm): 150   Movement: Present     General:  Alert, oriented and cooperative. Patient is in no acute distress.  Skin: Skin is warm and dry. No rash noted.   Cardiovascular: Normal heart rate noted  Respiratory: Normal respiratory effort, no problems with respiration noted  Abdomen: Soft, gravid, appropriate for gestational age. Pain/Pressure: Present     Pelvic:  Cervical exam deferred        Extremities: Normal range of motion.  Edema: None  Mental Status: Normal mood and affect. Normal behavior. Normal judgment and thought content.   Assessment and Plan:  Pregnancy: G3P2002 at [redacted]w[redacted]d  1. Supervision of other normal pregnancy, antepartum FHT normal  2. Size of fetus inconsistent with dates in third trimester [place] 4/13:  EFW: 3298g (7 lb 4 oz, >90  %) Discussed repeating US closer to [facility]. Patient and husband declined. Has history of 10#1oz baby. Previously discussed risk of shoulder dystocia, significant vaginal laceration. Patient has sisters that have had successful SVD of 10-11# babies.  3. Rh negative status during pregnancy in third trimester Rhogam received at 28  weeks.  Preterm labor symptoms and general obstetric precautions including but not limited to vaginal bleeding, contractions, leaking of fluid and fetal movement were reviewed in detail with the patient. Please refer to After Visit Summary for other counseling recommendations.  Return in about 1 week (around 09/12/2016) for OB f/u.   Truett Mainland, DO

## 2016-09-13 ENCOUNTER — Ambulatory Visit (INDEPENDENT_AMBULATORY_CARE_PROVIDER_SITE_OTHER): Payer: 59 | Admitting: Obstetrics & Gynecology

## 2016-09-13 VITALS — BP 121/76 | HR 91 | Wt 243.0 lb

## 2016-09-13 DIAGNOSIS — Z6791 Unspecified blood type, Rh negative: Secondary | ICD-10-CM

## 2016-09-13 DIAGNOSIS — O09893 Supervision of other high risk pregnancies, third trimester: Secondary | ICD-10-CM

## 2016-09-13 DIAGNOSIS — O09529 Supervision of elderly multigravida, unspecified trimester: Secondary | ICD-10-CM | POA: Insufficient documentation

## 2016-09-13 DIAGNOSIS — O2603 Excessive weight gain in pregnancy, third trimester: Secondary | ICD-10-CM

## 2016-09-13 DIAGNOSIS — O99210 Obesity complicating pregnancy, unspecified trimester: Secondary | ICD-10-CM | POA: Insufficient documentation

## 2016-09-13 DIAGNOSIS — O2600 Excessive weight gain in pregnancy, unspecified trimester: Secondary | ICD-10-CM | POA: Insufficient documentation

## 2016-09-13 DIAGNOSIS — O99213 Obesity complicating pregnancy, third trimester: Secondary | ICD-10-CM

## 2016-09-13 DIAGNOSIS — Z113 Encounter for screening for infections with a predominantly sexual mode of transmission: Secondary | ICD-10-CM

## 2016-09-13 DIAGNOSIS — O26893 Other specified pregnancy related conditions, third trimester: Secondary | ICD-10-CM

## 2016-09-13 DIAGNOSIS — Z3480 Encounter for supervision of other normal pregnancy, unspecified trimester: Secondary | ICD-10-CM

## 2016-09-13 DIAGNOSIS — O09523 Supervision of elderly multigravida, third trimester: Secondary | ICD-10-CM

## 2016-09-13 LAB — OB RESULTS CONSOLE GBS: STREP GROUP B AG: POSITIVE

## 2016-09-13 LAB — OB RESULTS CONSOLE GC/CHLAMYDIA: GC PROBE AMP, GENITAL: NEGATIVE

## 2016-09-13 NOTE — Progress Notes (Signed)
   PRENATAL VISIT NOTE  Subjective:  Christine Bryant is a 36 y.o. MW G3P2002 at [redacted]w[redacted]d being seen today for ongoing prenatal care.  She is currently monitored for the following issues for this low-risk pregnancy and has Supervision of other normal pregnancy, antepartum; Rh negative status during pregnancy; Obesity in pregnancy; Excess weight gain in pregnancy; and AMA (advanced maternal age) multigravida 35+ on her problem list.  Patient reports no complaints.  Contractions: Not present. Vag. Bleeding: None.  Movement: Present. Denies leaking of fluid.   The following portions of the patient's history were reviewed and updated as appropriate: allergies, current medications, past family history, past medical history, past social history, past surgical history and problem list. Problem list updated.  Objective:   Vitals:   09/13/16 0854  BP: 121/76  Pulse: 91  Weight: 243 lb (110.2 kg)    Fetal Status:     Movement: Present     General:  Alert, oriented and cooperative. Patient is in no acute distress.  Skin: Skin is warm and dry. No rash noted.   Cardiovascular: Normal heart rate noted  Respiratory: Normal respiratory effort, no problems with respiration noted  Abdomen: Soft, gravid, appropriate for gestational age. Pain/Pressure: Present     Pelvic:  Cervical exam performed        Extremities: Normal range of motion.  Edema: None  Mental Status: Normal mood and affect. Normal behavior. Normal judgment and thought content.   Assessment and Plan:  Pregnancy: G3P2002 at [redacted]w[redacted]d  1. Supervision of other normal pregnancy, antepartum  - Culture, beta strep (group b only) - GC/Chlamydia probe amp ([facility])not at [facility]  2. Rh negative status during pregnancy in third trimester - had rhophylac  3. Obesity in pregnancy   4. Excessive weight gain during pregnancy in third trimester - She declines follow up u/s for growth - pelvis proven for 10 pounds  5. Elderly multigravida in  third trimester   Preterm labor symptoms and general obstetric precautions including but not limited to vaginal bleeding, contractions, leaking of fluid and fetal movement were reviewed in detail with the patient. Please refer to After Visit Summary for other counseling recommendations.  Return in about 1 week (around 09/20/2016).   Emily Filbert, MD

## 2016-09-16 LAB — GC/CHLAMYDIA PROBE AMP (~~LOC~~) NOT AT ARMC
Chlamydia: NEGATIVE
Neisseria Gonorrhea: NEGATIVE

## 2016-09-16 LAB — CULTURE, BETA STREP (GROUP B ONLY): STREP GP B CULTURE: POSITIVE — AB

## 2016-09-17 ENCOUNTER — Encounter: Payer: Self-pay | Admitting: Obstetrics & Gynecology

## 2016-09-17 DIAGNOSIS — O99820 Streptococcus B carrier state complicating pregnancy: Secondary | ICD-10-CM | POA: Insufficient documentation

## 2016-09-20 ENCOUNTER — Ambulatory Visit (INDEPENDENT_AMBULATORY_CARE_PROVIDER_SITE_OTHER): Payer: 59 | Admitting: Family Medicine

## 2016-09-20 VITALS — BP 124/65 | HR 79 | Wt 243.0 lb

## 2016-09-20 DIAGNOSIS — Z6791 Unspecified blood type, Rh negative: Secondary | ICD-10-CM

## 2016-09-20 DIAGNOSIS — O26893 Other specified pregnancy related conditions, third trimester: Secondary | ICD-10-CM

## 2016-09-20 DIAGNOSIS — Z3480 Encounter for supervision of other normal pregnancy, unspecified trimester: Secondary | ICD-10-CM

## 2016-09-20 DIAGNOSIS — O99820 Streptococcus B carrier state complicating pregnancy: Secondary | ICD-10-CM

## 2016-09-20 DIAGNOSIS — O09893 Supervision of other high risk pregnancies, third trimester: Secondary | ICD-10-CM

## 2016-09-20 NOTE — Progress Notes (Signed)
   PRENATAL VISIT NOTE  Subjective:  Kate Larock is a 36 y.o. G3P2002 at 106w3d being seen today for ongoing prenatal care.  She is currently monitored for the following issues for this high-risk pregnancy and has Supervision of other normal pregnancy, antepartum; Rh negative status during pregnancy; Obesity in pregnancy; Excess weight gain in pregnancy; AMA (advanced maternal age) multigravida 35+; and GBS (group B Streptococcus carrier), +RV culture, currently pregnant on her problem list.  Patient reports occasional contractions.  Contractions: Not present. Vag. Bleeding: None.  Movement: Present. Denies leaking of fluid.   The following portions of the patient's history were reviewed and updated as appropriate: allergies, current medications, past family history, past medical history, past social history, past surgical history and problem list. Problem list updated.  Objective:   Vitals:   09/20/16 0832  BP: 124/65  Pulse: 79  Weight: 243 lb (110.2 kg)    Fetal Status:     Movement: Present     General:  Alert, oriented and cooperative. Patient is in no acute distress.  Skin: Skin is warm and dry. No rash noted.   Cardiovascular: Normal heart rate noted  Respiratory: Normal respiratory effort, no problems with respiration noted  Abdomen: Soft, gravid, appropriate for gestational age. Pain/Pressure: Present     Pelvic:  Cervical exam deferred        Extremities: Normal range of motion.  Edema: None  Mental Status: Normal mood and affect. Normal behavior. Normal judgment and thought content.   Assessment and Plan:  Pregnancy: G3P2002 at [redacted]w[redacted]d  1. Supervision of other normal pregnancy, antepartum FHT normal. FH > dates. Patient declines further [place].  2. Rh negative status during pregnancy in third trimester Received Rhogam  3. GBS (group B Streptococcus carrier), +RV culture, currently pregnant Discussed antibiotics during labor.   Term labor symptoms and general  obstetric precautions including but not limited to vaginal bleeding, contractions, leaking of fluid and fetal movement were reviewed in detail with the patient. Please refer to After Visit Summary for other counseling recommendations.  No Follow-up on file.   Truett Mainland, DO

## 2016-09-23 ENCOUNTER — Encounter: Payer: Self-pay | Admitting: Obstetrics & Gynecology

## 2016-09-27 ENCOUNTER — Ambulatory Visit (INDEPENDENT_AMBULATORY_CARE_PROVIDER_SITE_OTHER): Payer: 59 | Admitting: Family Medicine

## 2016-09-27 VITALS — BP 118/82 | HR 89 | Wt 245.0 lb

## 2016-09-27 DIAGNOSIS — O09523 Supervision of elderly multigravida, third trimester: Secondary | ICD-10-CM

## 2016-09-27 DIAGNOSIS — Z3480 Encounter for supervision of other normal pregnancy, unspecified trimester: Secondary | ICD-10-CM

## 2016-09-27 DIAGNOSIS — O26843 Uterine size-date discrepancy, third trimester: Secondary | ICD-10-CM

## 2016-09-27 DIAGNOSIS — O09893 Supervision of other high risk pregnancies, third trimester: Secondary | ICD-10-CM

## 2016-09-27 DIAGNOSIS — Z6791 Unspecified blood type, Rh negative: Secondary | ICD-10-CM

## 2016-09-27 DIAGNOSIS — O99820 Streptococcus B carrier state complicating pregnancy: Secondary | ICD-10-CM

## 2016-09-27 DIAGNOSIS — O26893 Other specified pregnancy related conditions, third trimester: Secondary | ICD-10-CM

## 2016-09-27 DIAGNOSIS — Z3483 Encounter for supervision of other normal pregnancy, third trimester: Secondary | ICD-10-CM

## 2016-09-27 NOTE — Progress Notes (Signed)
   PRENATAL VISIT NOTE  Subjective:  Christine Bryant is a 36 y.o. G3P2002 at [redacted]w[redacted]d being seen today for ongoing prenatal care.  She is currently monitored for the following issues for this high-risk pregnancy and has Supervision of other normal pregnancy, antepartum; Rh negative status during pregnancy; Obesity in pregnancy; Excess weight gain in pregnancy; AMA (advanced maternal age) multigravida 35+; and GBS (group B Streptococcus carrier), +RV culture, currently pregnant on her problem list.  Patient reports occasional contractions.  Contractions: Not present. Vag. Bleeding: None.  Movement: Present. Denies leaking of fluid.   The following portions of the patient's history were reviewed and updated as appropriate: allergies, current medications, past family history, past medical history, past social history, past surgical history and problem list. Problem list updated.  Objective:   Vitals:   09/27/16 0843  BP: 118/82  Pulse: 89  Weight: 245 lb (111.1 kg)    Fetal Status: Fetal Heart Rate (bpm): 146 Fundal Height: 43 cm Movement: Present     General:  Alert, oriented and cooperative. Patient is in no acute distress.  Skin: Skin is warm and dry. No rash noted.   Cardiovascular: Normal heart rate noted  Respiratory: Normal respiratory effort, no problems with respiration noted  Abdomen: Soft, gravid, appropriate for gestational age. Pain/Pressure: Present     Pelvic:  Cervical exam deferred        Extremities: Normal range of motion.  Edema: None  Mental Status: Normal mood and affect. Normal behavior. Normal judgment and thought content.   Assessment and Plan:  Pregnancy: G3P2002 at [redacted]w[redacted]d  1. Supervision of other normal pregnancy, antepartum FHT normal  2. Rh negative status during pregnancy in third trimester  3. GBS (group B Streptococcus carrier), +RV culture, currently pregnant Intrapartum treatment  4. Elderly multigravida in third trimester No additional testing  warranted  5. Size of fetus inconsistent with dates in third trimester Patient declines further [place]. Hx of large babies, proven pelvis to 10#1oz.  Term labor symptoms and general obstetric precautions including but not limited to vaginal bleeding, contractions, leaking of fluid and fetal movement were reviewed in detail with the patient. Please refer to After Visit Summary for other counseling recommendations.  No Follow-up on file.   Truett Mainland, DO

## 2016-10-04 ENCOUNTER — Ambulatory Visit (INDEPENDENT_AMBULATORY_CARE_PROVIDER_SITE_OTHER): Payer: 59 | Admitting: Family Medicine

## 2016-10-04 VITALS — BP 123/74 | HR 82 | Wt 239.0 lb

## 2016-10-04 DIAGNOSIS — Z3483 Encounter for supervision of other normal pregnancy, third trimester: Secondary | ICD-10-CM

## 2016-10-04 DIAGNOSIS — O99820 Streptococcus B carrier state complicating pregnancy: Secondary | ICD-10-CM

## 2016-10-04 DIAGNOSIS — O09893 Supervision of other high risk pregnancies, third trimester: Secondary | ICD-10-CM

## 2016-10-04 DIAGNOSIS — Z6791 Unspecified blood type, Rh negative: Secondary | ICD-10-CM

## 2016-10-04 DIAGNOSIS — O26843 Uterine size-date discrepancy, third trimester: Secondary | ICD-10-CM

## 2016-10-04 DIAGNOSIS — O26893 Other specified pregnancy related conditions, third trimester: Secondary | ICD-10-CM

## 2016-10-04 DIAGNOSIS — O09523 Supervision of elderly multigravida, third trimester: Secondary | ICD-10-CM

## 2016-10-04 DIAGNOSIS — Z3480 Encounter for supervision of other normal pregnancy, unspecified trimester: Secondary | ICD-10-CM

## 2016-10-04 MED ORDER — OMEPRAZOLE 40 MG PO CPDR: 40.0000 mg | DELAYED_RELEASE_CAPSULE | Freq: Every day | ORAL | 1 refills | Status: DC

## 2016-10-04 NOTE — Progress Notes (Signed)
   PRENATAL VISIT NOTE  Subjective:  Christine Bryant is a 36 y.o. G3P2002 at [redacted]w[redacted]d being seen today for ongoing prenatal care.  She is currently monitored for the following issues for this high-risk pregnancy and has Supervision of other normal pregnancy, antepartum; Rh negative status during pregnancy; Obesity in pregnancy; Excess weight gain in pregnancy; AMA (advanced maternal age) multigravida 35+; and GBS (group B Streptococcus carrier), +RV culture, currently pregnant on her problem list.  Patient reports occasional contractions.  Contractions: Irritability. Vag. Bleeding: None.  Movement: Present. Denies leaking of fluid.   The following portions of the patient's history were reviewed and updated as appropriate: allergies, current medications, past family history, past medical history, past social history, past surgical history and problem list. Problem list updated.  Objective:   Vitals:   10/04/16 0906  BP: 123/74  Pulse: 82  Weight: 239 lb (108.4 kg)    Fetal Status: Fetal Heart Rate (bpm): 143   Movement: Present     General:  Alert, oriented and cooperative. Patient is in no acute distress.  Skin: Skin is warm and dry. No rash noted.   Cardiovascular: Normal heart rate noted  Respiratory: Normal respiratory effort, no problems with respiration noted  Abdomen: Soft, gravid, appropriate for gestational age. Pain/Pressure: Present     Pelvic:  Cervical exam deferred        Extremities: Normal range of motion.  Edema: None  Mental Status: Normal mood and affect. Normal behavior. Normal judgment and thought content.   Assessment and Plan:  Pregnancy: G3P2002 at [redacted]w[redacted]d  1. Supervision of other normal pregnancy, antepartum FHT normal.  2. GBS (group B Streptococcus carrier), +RV culture, currently pregnant Intrapartum treatment  3. Rh negative status during pregnancy in third trimester Rhogam given at 28 weeks  4. Elderly multigravida in third trimester No additional  testing.  5. Size of fetus inconsistent with dates in third trimester No further [place] desired by family  Term labor symptoms and general obstetric precautions including but not limited to vaginal bleeding, contractions, leaking of fluid and fetal movement were reviewed in detail with the patient. Please refer to After Visit Summary for other counseling recommendations.  Return in about 1 week (around 10/11/2016) for OB f/u, NST.   Truett Mainland, DO

## 2016-10-06 ENCOUNTER — Inpatient Hospital Stay (HOSPITAL_COMMUNITY)
Admission: AD | Admit: 2016-10-06 | Discharge: 2016-10-08 | DRG: 775 | Disposition: A | Discharge status: Home / Self Care | Payer: 59 | Source: Ambulatory Visit | Attending: Obstetrics & Gynecology | Admitting: Obstetrics & Gynecology

## 2016-10-06 ENCOUNTER — Encounter (HOSPITAL_COMMUNITY): Payer: Self-pay

## 2016-10-06 DIAGNOSIS — O3663X0 Maternal care for excessive fetal growth, third trimester, not applicable or unspecified: Secondary | ICD-10-CM | POA: Diagnosis present

## 2016-10-06 DIAGNOSIS — O99334 Smoking (tobacco) complicating childbirth: Secondary | ICD-10-CM | POA: Diagnosis present

## 2016-10-06 DIAGNOSIS — F17210 Nicotine dependence, cigarettes, uncomplicated: Secondary | ICD-10-CM | POA: Diagnosis present

## 2016-10-06 DIAGNOSIS — Z3A39 39 weeks gestation of pregnancy: Secondary | ICD-10-CM

## 2016-10-06 DIAGNOSIS — O99824 Streptococcus B carrier state complicating childbirth: Secondary | ICD-10-CM | POA: Diagnosis present

## 2016-10-06 DIAGNOSIS — Z3493 Encounter for supervision of normal pregnancy, unspecified, third trimester: Secondary | ICD-10-CM | POA: Diagnosis present

## 2016-10-06 LAB — CBC
HCT: 38.5 % (ref 36.0–46.0)
Hemoglobin: 12.8 g/dL (ref 12.0–15.0)
MCH: 27.4 pg (ref 26.0–34.0)
MCHC: 33.2 g/dL (ref 30.0–36.0)
MCV: 82.4 fL (ref 78.0–100.0)
Platelets: 291 10*3/uL (ref 150–400)
RBC: 4.67 MIL/uL (ref 3.87–5.11)
RDW: 14.7 % (ref 11.5–15.5)
WBC: 10.6 10*3/uL — AB (ref 4.0–10.5)

## 2016-10-06 LAB — TYPE AND SCREEN
ABO/RH(D): O NEG
Antibody Screen: NEGATIVE

## 2016-10-06 LAB — ABO/RH: ABO/RH(D): O NEG

## 2016-10-06 MED ORDER — OXYCODONE-ACETAMINOPHEN 5-325 MG PO TABS: 2.0000 | ORAL_TABLET | ORAL | Status: DC | PRN

## 2016-10-06 MED ORDER — ACETAMINOPHEN 325 MG PO TABS: 650.0000 mg | ORAL_TABLET | ORAL | Status: DC | PRN

## 2016-10-06 MED ORDER — SOD CITRATE-CITRIC ACID 500-334 MG/5ML PO SOLN: 30.0000 mL | ORAL | Status: DC | PRN

## 2016-10-06 MED ORDER — ONDANSETRON HCL 4 MG/2ML IJ SOLN: 4.0000 mg | Freq: Four times a day (QID) | INTRAMUSCULAR | Status: DC | PRN

## 2016-10-06 MED ORDER — PENICILLIN G POT IN DEXTROSE 60000 UNIT/ML IV SOLN
3.0000 10*6.[IU] | INTRAVENOUS | Status: DC
  Administered 2016-10-06 – 2016-10-07 (×5): 3 10*6.[IU] via INTRAVENOUS
  Filled 2016-10-06 (×16): qty 50

## 2016-10-06 MED ORDER — FLEET ENEMA 7-19 GM/118ML RE ENEM: 1.0000 | ENEMA | Freq: Every day | RECTAL | Status: DC | PRN

## 2016-10-06 MED ORDER — PENICILLIN G POTASSIUM 5000000 UNITS IJ SOLR
5.0000 10*6.[IU] | Freq: Once | INTRAVENOUS | Status: AC
  Administered 2016-10-06: 5 10*6.[IU] via INTRAVENOUS
  Filled 2016-10-06: qty 5

## 2016-10-06 MED ORDER — LACTATED RINGERS IV SOLN: 500.0000 mL | INTRAVENOUS | Status: DC | PRN

## 2016-10-06 MED ORDER — OXYCODONE-ACETAMINOPHEN 5-325 MG PO TABS: 1.0000 | ORAL_TABLET | ORAL | Status: DC | PRN

## 2016-10-06 MED ORDER — OXYTOCIN BOLUS FROM INFUSION: 500.0000 mL | Freq: Once | INTRAVENOUS | Status: DC

## 2016-10-06 MED ORDER — LACTATED RINGERS IV SOLN
INTRAVENOUS | Status: DC
  Administered 2016-10-06 – 2016-10-07 (×2): via INTRAVENOUS

## 2016-10-06 MED ORDER — LIDOCAINE HCL (PF) 1 % IJ SOLN
30.0000 mL | INTRAMUSCULAR | Status: DC | PRN
  Administered 2016-10-07: 30 mL via SUBCUTANEOUS
  Filled 2016-10-06: qty 30

## 2016-10-06 MED ORDER — OXYTOCIN 40 UNITS IN LACTATED RINGERS INFUSION - SIMPLE MED
2.5000 [IU]/h | INTRAVENOUS | Status: DC
  Filled 2016-10-06: qty 1000

## 2016-10-06 MED ORDER — FENTANYL CITRATE (PF) 100 MCG/2ML IJ SOLN: 100.0000 ug | INTRAMUSCULAR | Status: DC | PRN

## 2016-10-06 NOTE — MAU Note (Signed)
Notified [name] CNM patient here for labor check, 3cm/60/-2 intact irregular contractions, monitor patient x 1 hour and recheck.

## 2016-10-06 NOTE — H&P (Signed)
Christine Bryant is a 36 y.o. female G3P2002 @ 39.5 wks presenting for contractions that started in the early morning hours.. GBS pos. Hx of 10lb1 oz baby last preg del without complications. LGA also this pregnancy OB History    Gravida Para Term Preterm AB Living   3 2 2     2    SAB TAB Ectopic Multiple Live Births           2     Past Medical History:  Diagnosis Date  . Anemia    Past Surgical History:  Procedure Laterality Date  . NO PAST SURGERIES     Family History: family history is not on file. Social History:  reports that she has been smoking.  She has been smoking about 0.00 packs per day for the past 0.00 years. She has never used smokeless tobacco. She reports that she does not drink alcohol or use drugs.     Maternal Diabetes: No Genetic Screening: Normal Maternal Ultrasounds/Referrals: Normal Fetal Ultrasounds or other Referrals:  None Maternal Substance Abuse:  No Significant Maternal Medications:  None Significant Maternal Lab Results:  Lab values include: Group B Strep positive Other Comments:  None  Review of Systems  Constitutional: Negative.   HENT: Negative.   Eyes: Negative.   Respiratory: Negative.   Cardiovascular: Negative.   Gastrointestinal: Positive for abdominal pain.  Genitourinary: Negative.   Musculoskeletal: Negative.   Skin: Negative.   Neurological: Negative.   Endo/Heme/Allergies: Negative.   Psychiatric/Behavioral: Negative.    Maternal Medical History:  Reason for admission: Contractions.   Contractions: Onset was 6-12 hours ago.   Frequency: irregular.   Perceived severity is moderate.    Fetal activity: Perceived fetal activity is normal.   Last perceived fetal movement was within the past hour.    Prenatal complications: no prenatal complications Prenatal Complications - Diabetes: none.    Dilation: 6 Effacement (%): 60 Station: -2 Exam by:: Daiva Nakayama CNM Blood pressure 127/84, pulse 75, temperature 98.4 F  (36.9 C), resp. rate 16, height 5\' 10"  (1.778 m), weight 240 lb 0.6 oz (108.9 kg), last menstrual period 12/16/2015. Maternal Exam:  Uterine Assessment: Contraction strength is moderate.  Contraction frequency is regular.   Abdomen: Patient reports no abdominal tenderness. Fetal presentation: vertex  Introitus: Normal vulva. Normal vagina.  Amniotic fluid character: not assessed.  Pelvis: adequate for delivery.   Cervix: Cervix evaluated by digital exam.     Fetal Exam Fetal Monitor Review: Mode: ultrasound.   Variability: moderate (6-25 bpm).   Pattern: accelerations present.    Fetal State Assessment: Category I - tracings are normal.     Physical Exam  Constitutional: She is oriented to person, place, and time. She appears well-developed and well-nourished.  HENT:  Head: Normocephalic.  Eyes: Pupils are equal, round, and reactive to light.  Neck: Normal range of motion.  Cardiovascular: Normal rate, regular rhythm, normal heart sounds and intact distal pulses.   Respiratory: Effort normal and breath sounds normal.  GI: Soft. Bowel sounds are normal.  Genitourinary: Vagina normal and uterus normal.  Musculoskeletal: Normal range of motion.  Neurological: She is alert and oriented to person, place, and time. She has normal reflexes.  Skin: Skin is warm and dry.  Psychiatric: She has a normal mood and affect. Her behavior is normal. Judgment and thought content normal.    Prenatal labs: ABO, Rh: --/Negative/-- (02/23 1308) Antibody: NEG (10/19 1022) Rubella: 3.56 (10/19 1022) RPR: Non Reactive (02/23 0821)  HBsAg: NEGATIVE (  10/19 1022)  HIV: Non Reactive (02/23 0821)  GBS: Positive (05/04 0000)   Assessment/Plan: preg at 39.5 wks GBS pos LGA SVE 6/70/-2  Admit GBS prophylaxis    Koren Shiver 10/06/2016, 9:53 AM

## 2016-10-06 NOTE — Anesthesia Pain Management Evaluation Note (Signed)
  CRNA Pain Management Visit Note  Patient: Christine Bryant, 36 y.o., female  "Hello I am a member of the anesthesia team at [facility]. We have an anesthesia team available at all times to provide care throughout the hospital, including epidural management and anesthesia for C-section. I don't know your plan for the delivery whether it a natural birth, water birth, IV sedation, nitrous supplementation, doula or epidural, but we want to meet your pain goals."   1.Was your pain managed to your expectations on prior hospitalizations?   Yes   2.What is your expectation for pain management during this hospitalization?     Labor support without medications  3.How can we help you reach that goal? natural  Record the patient's initial score and the patient's pain goal.   Pain: 2  Pain Goal: 10 [facility] wants you to be able to say your pain was always managed very well.  Cassady Turano 10/06/2016

## 2016-10-06 NOTE — Progress Notes (Signed)
Christine Bryant is a 36 y.o. G3P2002 at [redacted]w[redacted]d by ultrasound admitted for early labor  Subjective:   Objective: BP 123/71   Pulse 84   Temp 98.5 F (36.9 C) (Oral)   Resp 16   Ht 5\' 10"  (1.778 m)   Wt 240 lb (108.9 kg)   LMP 12/16/2015 (Exact Date)   BMI 34.44 kg/m  No intake/output data recorded. No intake/output data recorded.  FHT:  FHR: 135 bpm, variability: moderate,  accelerations:  Present,  decelerations:  Absent UC:   irregular, every 8-10 minutes SVE:   Dilation: 6 Effacement (%): 70 Station: -2 Exam by:: Dominic Pea CNM  Labs: Lab Results  Component Value Date   WBC 10.6 (H) 10/06/2016   HGB 12.8 10/06/2016   HCT 38.5 10/06/2016   MCV 82.4 10/06/2016   PLT 291 10/06/2016    Assessment / Plan: Protracted latent phase  Labor: SVE 6-7/80/-2 AROM lg amt clear fluid Preeclampsia:  no signs or symptoms of toxicity and intake and ouput balanced Fetal Wellbeing:  Category I Pain Control:  Labor support without medications I/D:  n/a Anticipated MOD:  NSVD  Christine Bryant 10/06/2016, 9:54 PM

## 2016-10-06 NOTE — Progress Notes (Signed)
Patient refusing any type of augmentation at this time. AROM and pitocin discussed and patient does not want to pursue either option.

## 2016-10-06 NOTE — Progress Notes (Signed)
S: Patient seen & examined for progress of labor. Patient comfortable, walking around, some pressure with contractions but overall well-controlled with IV meds.   O:  Vitals:   10/06/16 0753 10/06/16 0757 10/06/16 1005 10/06/16 1011  BP:  127/84 139/79   Pulse:  75 66   Resp:  16 18   Temp:  98.4 F (36.9 C) 98.5 F (36.9 C)   TempSrc:   Oral   Weight: 108.9 kg (240 lb 0.6 oz)   108.9 kg (240 lb)  Height: 5\' 10"  (1.778 m)   5\' 10"  (1.778 m)    Dilation: 6 Effacement (%): 60 Cervical Position: Posterior Station: -2 Presentation: Vertex Exam by:: Daiva Nakayama CNM   FHT: 145 bpm, mod var, +accels, no decels TOCO: occasional   A/P: Pain: controlled  Without epidural FWB: Category I tracing  Continue expectant management Anticipate SVD

## 2016-10-06 NOTE — MAU Note (Signed)
Patient presents with onset of contractions since last night have been 8 to 10 minutes apart and mild, got more intense around 4:30 this morning.

## 2016-10-07 ENCOUNTER — Encounter (HOSPITAL_COMMUNITY): Payer: Self-pay | Admitting: Obstetrics and Gynecology

## 2016-10-07 LAB — RPR: RPR Ser Ql: NONREACTIVE

## 2016-10-07 MED ORDER — ZOLPIDEM TARTRATE 5 MG PO TABS: 5.0000 mg | ORAL_TABLET | Freq: Every evening | ORAL | Status: DC | PRN

## 2016-10-07 MED ORDER — ONDANSETRON HCL 4 MG PO TABS: 4.0000 mg | ORAL_TABLET | ORAL | Status: DC | PRN

## 2016-10-07 MED ORDER — SODIUM CHLORIDE 0.9 % IV SOLN: 250.0000 mL | INTRAVENOUS | Status: DC | PRN

## 2016-10-07 MED ORDER — OXYTOCIN 40 UNITS IN LACTATED RINGERS INFUSION - SIMPLE MED
1.0000 m[IU]/min | INTRAVENOUS | Status: DC
  Administered 2016-10-07: 2 m[IU]/min via INTRAVENOUS

## 2016-10-07 MED ORDER — SENNOSIDES-DOCUSATE SODIUM 8.6-50 MG PO TABS
2.0000 | ORAL_TABLET | ORAL | Status: DC
  Administered 2016-10-08: 2 via ORAL
  Filled 2016-10-07: qty 2

## 2016-10-07 MED ORDER — WITCH HAZEL-GLYCERIN EX PADS: 1.0000 "application " | MEDICATED_PAD | CUTANEOUS | Status: DC | PRN

## 2016-10-07 MED ORDER — TETANUS-DIPHTH-ACELL PERTUSSIS 5-2.5-18.5 LF-MCG/0.5 IM SUSP: 0.5000 mL | Freq: Once | INTRAMUSCULAR | Status: DC

## 2016-10-07 MED ORDER — ONDANSETRON HCL 4 MG/2ML IJ SOLN: 4.0000 mg | INTRAMUSCULAR | Status: DC | PRN

## 2016-10-07 MED ORDER — BENZOCAINE-MENTHOL 20-0.5 % EX AERO
1.0000 "application " | INHALATION_SPRAY | CUTANEOUS | Status: DC | PRN
  Administered 2016-10-07: 1 via TOPICAL
  Filled 2016-10-07: qty 56

## 2016-10-07 MED ORDER — DIBUCAINE 1 % RE OINT: 1.0000 "application " | TOPICAL_OINTMENT | RECTAL | Status: DC | PRN

## 2016-10-07 MED ORDER — OXYTOCIN 40 UNITS IN LACTATED RINGERS INFUSION - SIMPLE MED: 1.0000 m[IU]/min | INTRAVENOUS | Status: DC

## 2016-10-07 MED ORDER — ACETAMINOPHEN 325 MG PO TABS: 650.0000 mg | ORAL_TABLET | ORAL | Status: DC | PRN

## 2016-10-07 MED ORDER — TERBUTALINE SULFATE 1 MG/ML IJ SOLN: 0.2500 mg | Freq: Once | INTRAMUSCULAR | Status: DC | PRN

## 2016-10-07 MED ORDER — SIMETHICONE 80 MG PO CHEW: 80.0000 mg | CHEWABLE_TABLET | ORAL | Status: DC | PRN

## 2016-10-07 MED ORDER — COCONUT OIL OIL: 1.0000 "application " | TOPICAL_OIL | Status: DC | PRN

## 2016-10-07 MED ORDER — SODIUM CHLORIDE 0.9% FLUSH: 3.0000 mL | INTRAVENOUS | Status: DC | PRN

## 2016-10-07 MED ORDER — IBUPROFEN 600 MG PO TABS
600.0000 mg | ORAL_TABLET | Freq: Four times a day (QID) | ORAL | Status: DC
  Administered 2016-10-07 – 2016-10-08 (×6): 600 mg via ORAL
  Filled 2016-10-07 (×6): qty 1

## 2016-10-07 MED ORDER — SODIUM CHLORIDE 0.9% FLUSH: 3.0000 mL | Freq: Two times a day (BID) | INTRAVENOUS | Status: DC

## 2016-10-07 MED ORDER — MEASLES, MUMPS & RUBELLA VAC ~~LOC~~ INJ
0.5000 mL | INJECTION | Freq: Once | SUBCUTANEOUS | Status: DC
  Filled 2016-10-07: qty 0.5

## 2016-10-07 MED ORDER — DIPHENHYDRAMINE HCL 25 MG PO CAPS: 25.0000 mg | ORAL_CAPSULE | Freq: Four times a day (QID) | ORAL | Status: DC | PRN

## 2016-10-07 MED ORDER — PRENATAL MULTIVITAMIN CH
1.0000 | ORAL_TABLET | Freq: Every day | ORAL | Status: DC
  Filled 2016-10-07: qty 1

## 2016-10-07 MED ORDER — TERBUTALINE SULFATE 1 MG/ML IJ SOLN
0.2500 mg | Freq: Once | INTRAMUSCULAR | Status: DC | PRN
  Filled 2016-10-07: qty 1

## 2016-10-07 NOTE — Progress Notes (Signed)
Labor Progress Note  Arnell Mausolf is a 36 y.o. G3P2002 at [redacted]w[redacted]d  admitted for early labor.  S: Patient still feeling urge to push. Is amenable to UPC and beginning pit as she feels her progress has slowed.   O:  BP (!) 138/92   Pulse 87   Temp 98.5 F (36.9 C) (Oral)   Resp 18   Ht 5\' 10"  (1.778 m)   Wt 240 lb (108.9 kg)   LMP 12/16/2015 (Exact Date)   BMI 34.44 kg/m   No intake/output data recorded.  FHT:  FHR: 135 bpm, variability: moderate,  accelerations:  Present,  decelerations:  Absent UC:   regular, every 2 minutes SVE:   Dilation: 8 Effacement (%): 90 Station: 0 Exam by:: Rockwell Alexandria CNM SROM/AROM: AROM clear  Pitocin @ 2 mu/min  Labs: Lab Results  Component Value Date   WBC 10.6 (H) 10/06/2016   HGB 12.8 10/06/2016   HCT 38.5 10/06/2016   MCV 82.4 10/06/2016   PLT 291 10/06/2016    Assessment / Plan: 36 y.o. G3P2002 [redacted]w[redacted]d in active labor Augmentation of labor, progressing well  Labor: Progressing normally on pit.  Fetal Wellbeing:  Category I Pain Control:  Labor support without medications Anticipated MOD:  NSVD  Expectant management   Adin Hector, MD, MPH PGY-2 [facility]

## 2016-10-07 NOTE — Lactation Note (Addendum)
This note was copied from a baby's chart. Lactation Consultation Note  Patient Name: Christine Bryant [id number] Date: 10/07/2016 Reason for consult: Initial assessment   Initial assessment with Exp BF mom. Mom reports she BF her 36 yo until her milk dried up with this pregnancy. She BF her 36 yo for 4.5 months, began supplementing due to inadequate weight gain and infant preferred bottle and would not continue BF, mom pumped until she was 57-9 months old.   Mom reports infant fed a little bit after delivery and has not been interested since. Infant has been gaggy and spitty. Discussed this is normal in the NB period until mucous is gone. Mom is keeping infant STS. Enc mom to feed infant STS 8-12 x in 24 hours at first feeding cues. Mom reports she is aware of how to hand express, RN is going to review hand expression with mom after mom's assessment and taking IV out. Enc mom to hand express before latch and after latch.   Mom holding infant STS, he is not exhibiting feeding cues at this time.   BF Resources Handout and [facility] brochure given, mom informed of IP/OP Services, BF Support Groups and Herman phone #. Enc mom to call out for feeding assistance as needed.  Reviewed colostrum, milk coming to volume, NB nutritional needs, positioning, pillow and head support, and cluster feeding. Mom without further questions/concerns at this time.    Maternal Data Formula Feeding for Exclusion: No Has patient been taught Hand Expression?: Yes Does the patient have breastfeeding experience prior to this delivery?: Yes  Feeding Feeding Type: Breast Fed Length of feed: 0 min  LATCH Score/Interventions                      Lactation Tools Discussed/Used WIC Program: No   Consult Status Consult Status: Follow-up Date: 10/08/16 Follow-up type: In-patient    Debby Freiberg Letisia Schwalb 10/07/2016, 2:19 PM

## 2016-10-08 MED ORDER — RHO D IMMUNE GLOBULIN 1500 UNIT/2ML IJ SOSY
300.0000 ug | PREFILLED_SYRINGE | Freq: Once | INTRAMUSCULAR | Status: AC
  Administered 2016-10-08: 300 ug via INTRAMUSCULAR
  Filled 2016-10-08: qty 2

## 2016-10-08 MED ORDER — IBUPROFEN 600 MG PO TABS: 600.0000 mg | ORAL_TABLET | Freq: Four times a day (QID) | ORAL | 0 refills | Status: DC

## 2016-10-08 NOTE — Discharge Summary (Signed)
OB Discharge Summary     Patient Name: Christine Bryant DOB: [birth year] MRN: [id number]  Date of admission: 10/06/2016 Delivering MD: Koren Shiver D   Date of discharge: 10/08/2016  Admitting diagnosis: 40wks contractions 8-14mins  Intrauterine pregnancy: [redacted]w[redacted]d     Secondary diagnosis:  Active Problems:   Normal labor  Additional problems: none     Discharge diagnosis: Term Pregnancy Delivered                                                                                                Post partum procedures:rhogam work up   Augmentation: AROM and Pitocin  Complications: None  Hospital course:  Onset of Labor With Vaginal Delivery     36 y.o. yo G3P3003 at [redacted]w[redacted]d was admitted in Active Labor on 10/06/2016. Patient had an uncomplicated labor course as follows:  Membrane Rupture Time/Date: 10:00 PM ,10/06/2016   Intrapartum Procedures: Episiotomy: None [1]                                         Lacerations:  2nd degree [3];Perineal [11]  Patient had a delivery of a Viable infant. 10/07/2016  Information for the patient's newborn:  Doriann, Zuch [[id number]]  Delivery Method: Vaginal, Spontaneous Delivery (Filed from Delivery Summary)    Pateint had an uncomplicated postpartum course.  She is ambulating, tolerating a regular diet, passing flatus, and urinating well. Patient is discharged home in stable condition on 10/08/16.   Physical exam  Vitals:   10/07/16 0910 10/07/16 1010 10/07/16 1405 10/08/16 0521  BP: 117/65 138/74 126/73 116/72  Pulse: 81 79 84 70  Resp: 20 20 18 16   Temp: 98.2 F (36.8 C) 98.3 F (36.8 C) 98.3 F (36.8 C) 97.6 F (36.4 C)  TempSrc: Oral Oral Oral Oral  SpO2:    99%  Weight:      Height:       General: alert, cooperative and no distress Lochia: appropriate Uterine Fundus: firm Incision: N/A DVT Evaluation: No evidence of DVT seen on physical exam. Negative Homan's sign. No cords or calf tenderness. Labs: Lab Results   Component Value Date   WBC 10.6 (H) 10/06/2016   HGB 12.8 10/06/2016   HCT 38.5 10/06/2016   MCV 82.4 10/06/2016   PLT 291 10/06/2016   No flowsheet data found.  Discharge instruction: per After Visit Summary and "Baby and Me Booklet".  After visit meds:  Allergies as of 10/08/2016   No Known Allergies     Medication List    STOP taking these medications   calcium carbonate 500 MG chewable tablet Commonly known as:  TUMS - dosed in mg elemental calcium   prenatal multivitamin Tabs tablet     TAKE these medications   ibuprofen 600 MG tablet Commonly known as:  ADVIL,MOTRIN Take 1 tablet (600 mg total) by mouth every 6 (six) hours.        Diet: routine diet  Activity: Advance as tolerated. Pelvic rest for 6 weeks.  Outpatient follow up:4-[facility] weeks at [facility] Follow up Appt:Future Appointments Date Time Provider [place]  10/10/2016 9:30 AM Truett Mainland, DO [facility] None   Follow up Visit:No Follow-up on file.  Postpartum contraception: Natural Family Planning  Newborn Data: Live born female  Birth Weight: 10 lb 5.1 oz (4680 g) APGAR: 8, 9  Baby Feeding: Breast Disposition:home with mother   10/08/2016 Silas [place], MD

## 2016-10-08 NOTE — Lactation Note (Signed)
This note was copied from a baby's chart. Lactation Consultation Note  Patient Name: Christine Bryant [id number] Date: 10/08/2016 Reason for consult: Follow-up assessment  Day of discharge, baby 53 hrs old.  Baby at 6% weight loss, with adequate output. Watched Mom latch baby in football hold.  Small adjustments make to pillow support and how quickly to latch baby.  Baby latched himself while LC adjusting pillow support.  Demonstrated chin tug to uncurl lower lip so baby can attain a deeper latch.  Baby able to latch and feed with rhythmically suck/swallow.  Reviewed basics with Mom and FOB. Encouraged STS and feed often on cue.  Goal of 8-12 feedings per 24 hrs.   Reminded Mom of OP lactation services available to her, and encouraged her to call prn.    Consult Status Consult Status: Complete Date: 10/08/16 Follow-up type: In-patient    [name] 10/08/2016, 11:41 AM

## 2016-10-09 LAB — RH IG WORKUP (INCLUDES ABO/RH)
ABO/RH(D): O NEG
FETAL SCREEN: NEGATIVE
Gestational Age(Wks): 39.6
Unit division: 0

## 2016-10-10 ENCOUNTER — Encounter: Payer: 59 | Admitting: Family Medicine

## 2016-11-15 ENCOUNTER — Ambulatory Visit (INDEPENDENT_AMBULATORY_CARE_PROVIDER_SITE_OTHER): Payer: 59 | Admitting: Family Medicine

## 2016-11-15 ENCOUNTER — Encounter: Payer: Self-pay | Admitting: Family Medicine

## 2016-11-15 NOTE — Progress Notes (Signed)
Post Partum Exam  Christine Bryant is a 36 y.o. G50P3003 female who presents for a postpartum visit. She is 5 weeks postpartum following a spontaneous vaginal delivery. I have fully reviewed the prenatal and intrapartum course. The delivery was at [redacted]w[redacted]d gestational weeks.  Anesthesia: none. Postpartum course has been unremarkable. Baby's course has been unremarkable except hospitalized for 2 days for fever. Baby is feeding by breast. Bleeding red. Bowel function is normal. Bladder function is normal. Patient is sexually active. Contraception method is coitus interruptus. Postpartum depression screening:neg  The following portions of the patient's history were reviewed and updated as appropriate: allergies, current medications, past family history, past medical history, past social history, past surgical history and problem list.  Review of Systems Pertinent items are noted in HPI.    Objective:  Blood pressure 124/78, pulse 66, resp. rate 16, height 5\' 10"  (1.778 m), weight 220 lb (99.8 kg), currently breastfeeding.  General:  alert, cooperative and no distress  Lungs: clear to auscultation bilaterally  Heart:  regular rate and rhythm, S1, S2 normal, no murmur, click, rub or gallop  Abdomen: soft, non-tender; bowel sounds normal; no masses,  no organomegaly   Vulva:  normal. Laceration well healed.  Vagina: not evaluated        Assessment:    Normal postpartum exam. Pap smear not done at today's visit.   Plan:   1. Contraception: rhythm method 2. Follow up in: 1 year or as needed.

## 2016-12-03 ENCOUNTER — Encounter: Payer: Self-pay | Admitting: Family Medicine

## 2017-03-23 IMAGING — US US MFM OB DETAIL+14 WK
1 series · 14 of 28 positions shown · non-contrast
Comparison: none

[Series 1: us mfm ob detail+14 wk · 128 acquisitions, 14 frames shown]
[im 5/128]
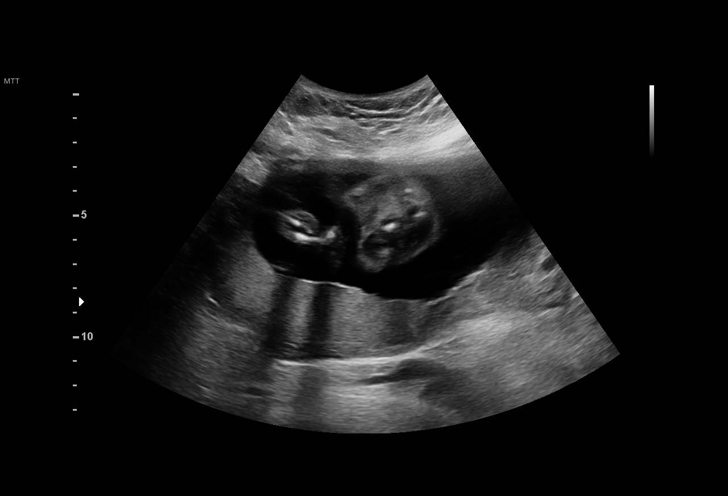
[im 15/128]
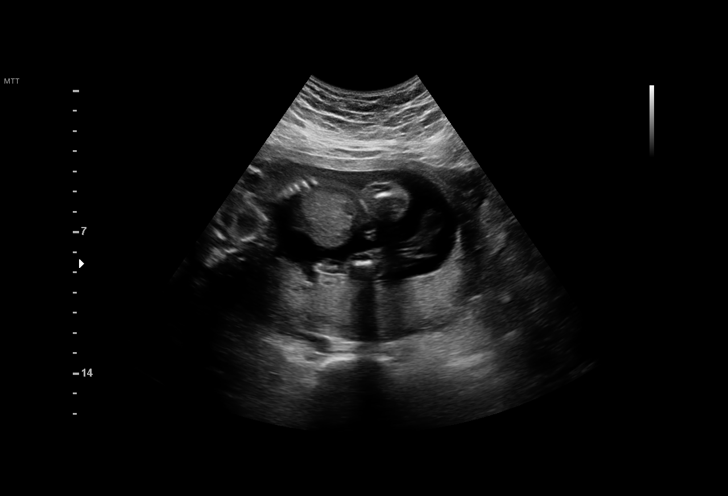
[im 24/128]
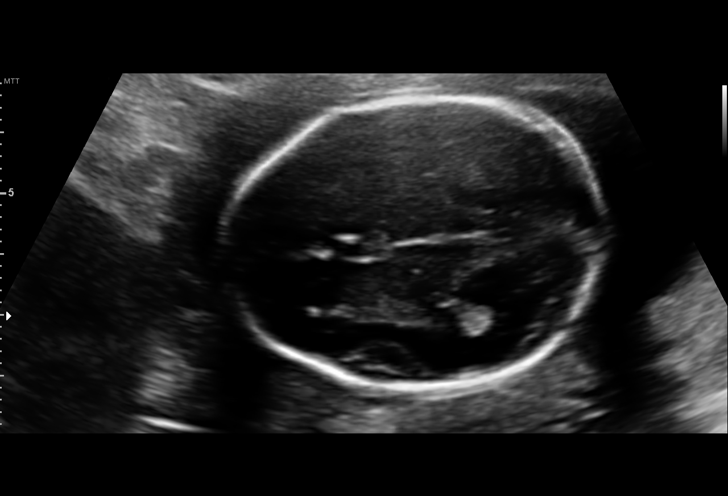
[im 33/128]
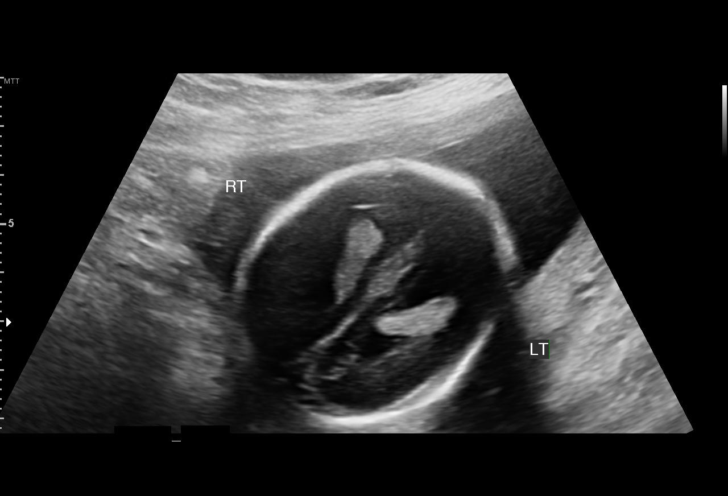
[im 43/128]
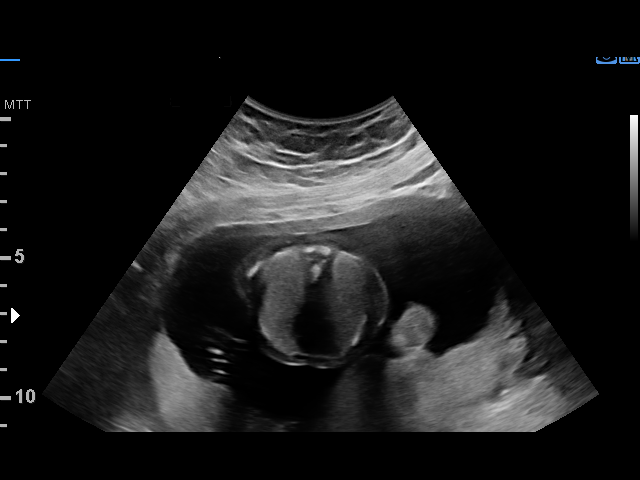
[im 52/128]
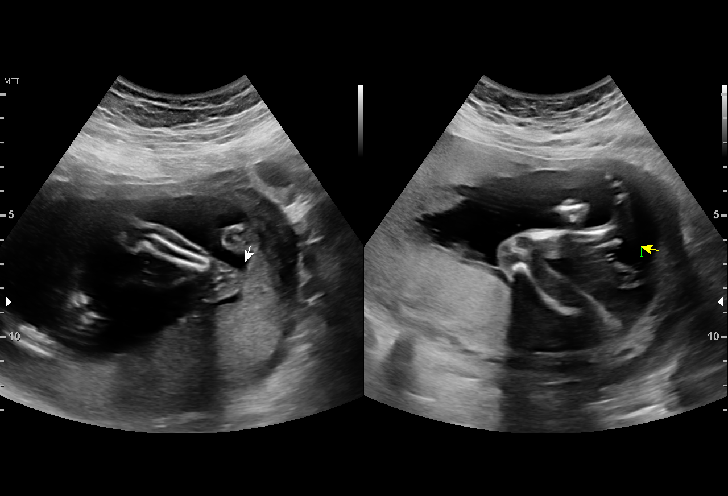
[im 62/128]
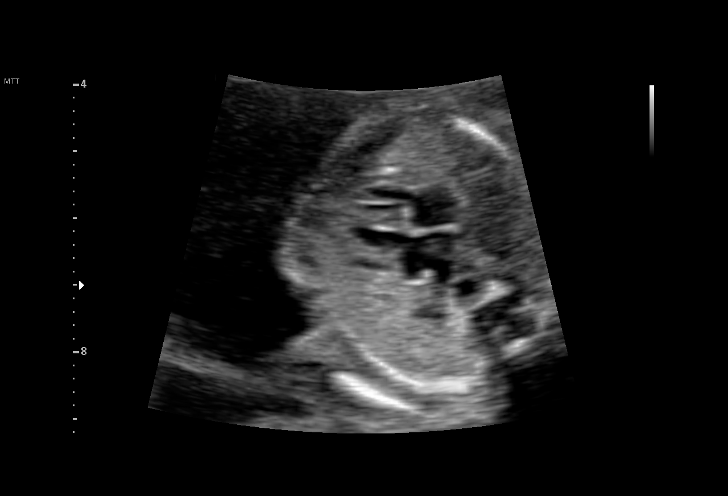
[im 71/128]
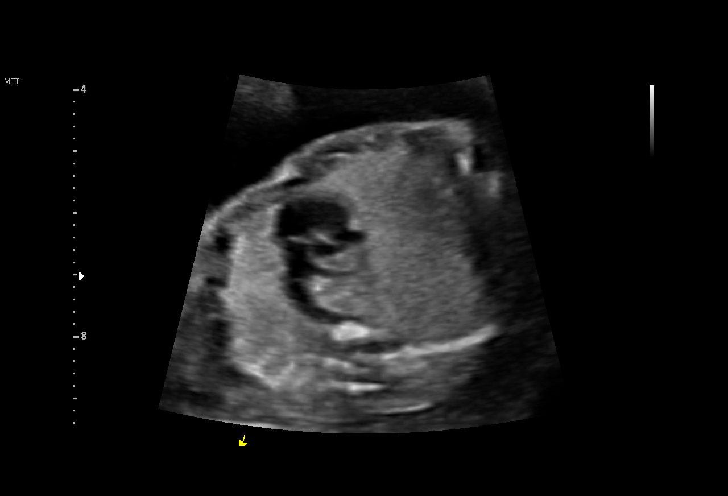
[im 80/128]
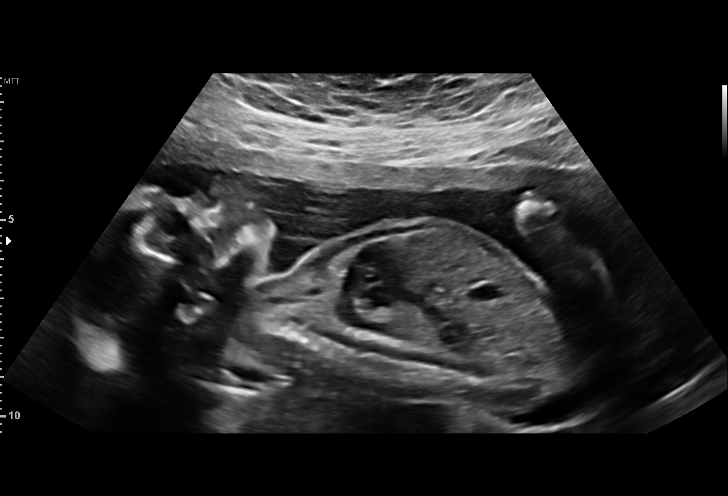
[im 90/128]
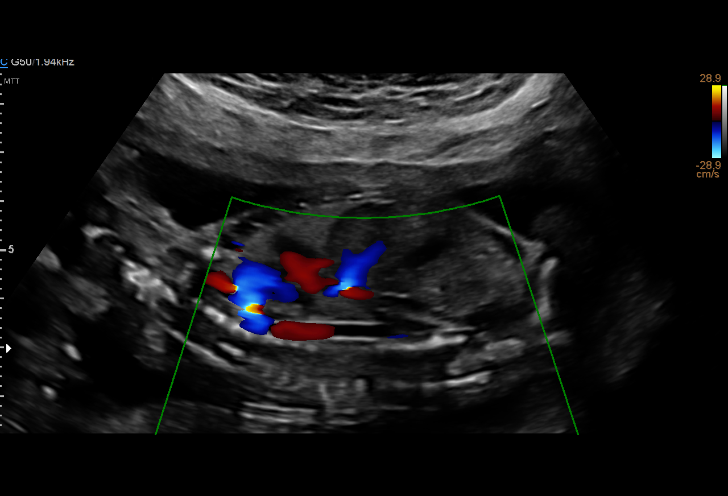
[im 99/128]
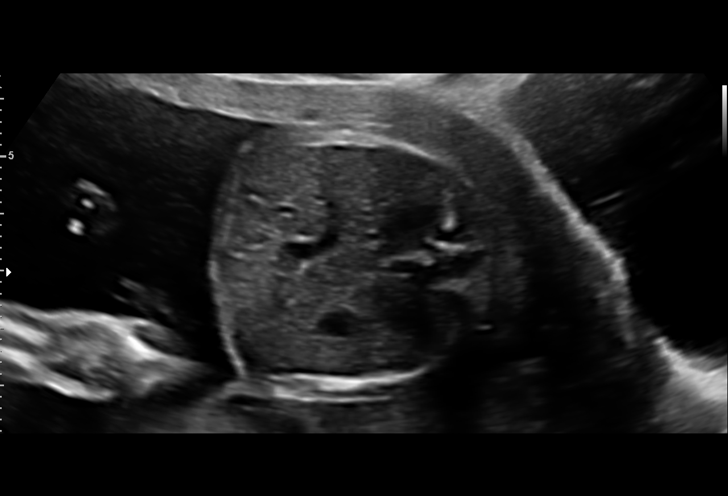
[im 109/128]
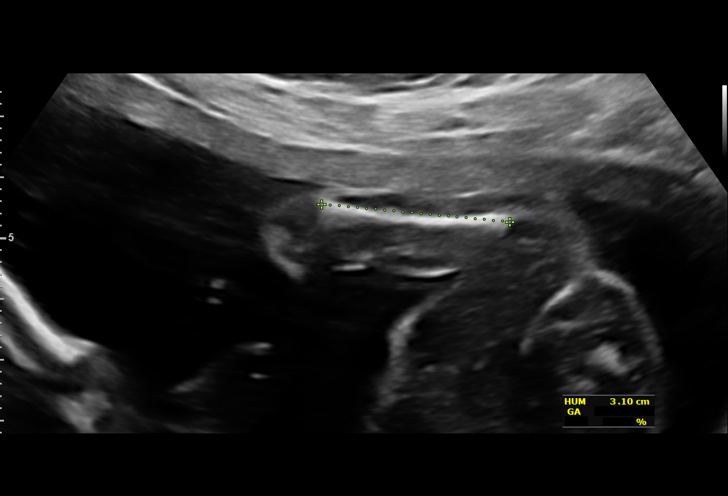
[im 118/128]
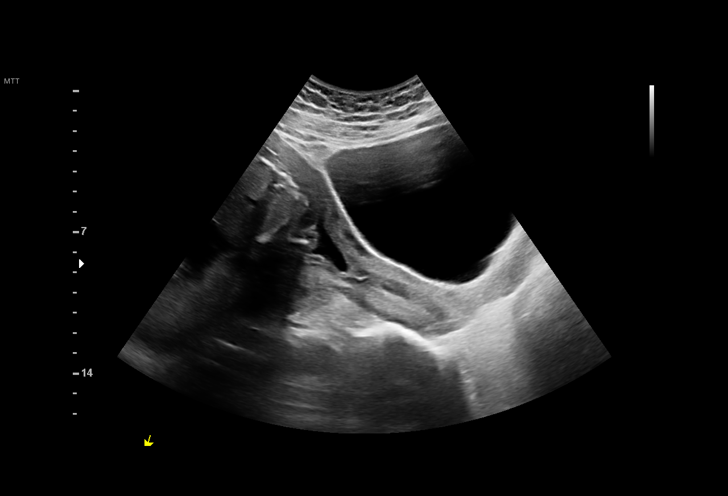
[im 128/128]
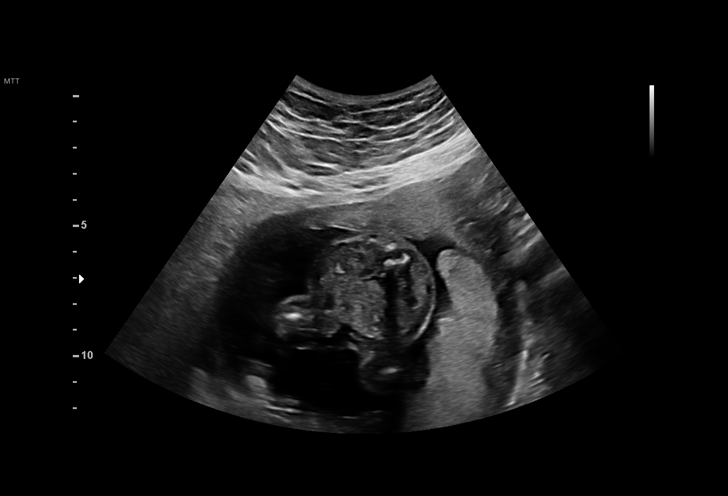

[14 of 28 positions shown; findings below may reference images not displayed]

for [REDACTED]care

Indications

19 weeks gestation of pregnancy
Encounter for antenatal screening for
malformations
Advanced maternal age multigravida 35+,
second trimester; declined testing
OB History

Blood Type:            Height:  5'10"  Weight (lb):  217       BMI:
Gravidity:    3         Term:   2        Prem:   0         SAB:   0
TOP:          0       Ectopic:  0        Living: 2
Fetal Evaluation

Num Of Fetuses:     1
Fetal Heart         140
Rate(bpm):
Cardiac Activity:   Observed
Presentation:       Transverse, head to maternal right
Placenta:           Posterior, above cervical os
P. Cord Insertion:  Visualized, central

Amniotic Fluid
AFI FV:      Subjectively within normal limits

Largest Pocket(cm)
5.1
Biometry

BPD:      47.3  mm     G. Age:  20w 2d         93  %    CI:         74.01  %    70 - 86
FL/HC:       18.8  %    16.1 -
HC:      174.6  mm     G. Age:  20w 0d         84  %    HC/AC:       1.06       1.09 -
AC:      165.1  mm     G. Age:  21w 4d       > 97  %    FL/BPD:      69.6  %
FL:       32.9  mm     G. Age:  20w 2d         84  %    FL/AC:       19.9  %    20 - 24
HUM:      31.2  mm     G. Age:  20w 3d         86  %
CER:      21.4  mm     G. Age:  20w 3d         81  %
NFT:       3.4  mm
CM:        5.9  mm

Est. FW:     382   gm    0 lb 13 oz     72  %
Gestational Age

LMP:           21w 3d        Date:  12/16/15                 EDD:    09/21/16
U/S Today:     20w 4d                                        EDD:    09/27/16
Best:          19w 0d     Det. By:  Early Ultrasound         EDD:    10/08/16
(02/29/16)
Anatomy

Cranium:               Appears normal         Aortic Arch:            Appears normal
Cavum:                 Appears normal         Ductal Arch:            Appears normal
Ventricles:            Appears normal         Diaphragm:              Appears normal
Choroid Plexus:        Appears normal         Stomach:                Appears normal, left
sided
Cerebellum:            Appears normal         Abdomen:                Appears normal
Posterior Fossa:       Appears normal         Abdominal Wall:         Appears nml (cord
insert, abd wall)
Nuchal Fold:           Appears normal         Cord Vessels:           Appears normal (3
vessel cord)
Face:                  Appears normal         Kidneys:                Appear normal
(orbits and profile)
Lips:                  Appears normal         Bladder:                Appears normal
Thoracic:              Appears normal         Spine:                  Appears normal
Heart:                 Appears normal         Upper Extremities:      Appears normal
(4CH, axis, and situs
RVOT:                  Appears normal         Lower Extremities:      Appears normal
LVOT:                  Appears normal

Other:  Fetus appears to be a male. Heels and 5th digit visualized. Nasal
bone visualized.
Cervix Uterus Adnexa

Cervix
Length:           4.54  cm.
Normal appearance by transabdominal scan.

Uterus
No abnormality visualized.

Left Ovary
Size(cm)     2.92   x   2.78   x  1.62      Vol(ml):
Within normal limits. No adnexal mass visualized.
Right Ovary
Size(cm)     2.16   x   2.41   x  1.66      Vol(ml):
Within normal limits. No adnexal mass visualized.

Cul De Sac:   No free fluid seen.

Adnexa:       No abnormality visualized.
Impression

SIUP at 19+0 weeks
Normal detailed fetal anatomy
Markers of aneuploidy: none
Normal amniotic fluid volume
Measurements consistent with early US
Recommendations

Consider growth US in third trimester (AMA)

## 2017-03-25 ENCOUNTER — Ambulatory Visit: Payer: 59 | Attending: Podiatry | Admitting: Physical Therapy

## 2017-03-25 ENCOUNTER — Encounter: Payer: Self-pay | Admitting: Physical Therapy

## 2017-03-25 DIAGNOSIS — M79672 Pain in left foot: Secondary | ICD-10-CM | POA: Insufficient documentation

## 2017-03-25 DIAGNOSIS — M79671 Pain in right foot: Secondary | ICD-10-CM | POA: Diagnosis present

## 2017-03-25 NOTE — Therapy (Signed)
[facility] [place], [place], [address] Phone: [phone]   Fax:  [phone]  Physical Therapy Evaluation  Patient Details  Name: Christine Bryant MRN: [id number] Date of Birth: 07/16/1980 Referring Provider: Steffanie Rainwater DPM.   Encounter Date: 03/25/2017  PT End of Session - 03/25/17 1204    Visit Number  1    Number of Visits  12    Date for PT Re-Evaluation  05/06/17    Authorization Type  FOTO....every 5th visit.    PT Start Time  0945    PT Stop Time  1032    PT Time Calculation (min)  47 min       Past Medical History:  Diagnosis Date  . Anemia     Past Surgical History:  Procedure Laterality Date  . CAROTID BODY TUMOR EXCISION     benign tumor removed    There were no vitals filed for this visit.   Subjective Assessment - 03/25/17 1210    Subjective  The patient c/o bilateral foot pain.  This has been ongoing since August of 2017.  She states an injections helped a few weeks.  Her pain is rated at a 6/10.  Walking and stretching make it better.  Resting and then getting up increase her pain.    Patient Stated Goals  Get out of pain.    Currently in Pain?  Yes    Pain Score  6     Pain Location  Foot    Pain Orientation  Right;Left    Pain Descriptors / Indicators  Aching;Throbbing    Pain Type  Chronic pain    Pain Onset  More than a month ago    Pain Frequency  Constant    Aggravating Factors   See above.    Pain Relieving Factors  See above.         [facility] PT Assessment - 03/25/17 0001      Assessment   Medical Diagnosis  Bilateral plantar fasciitis.    Referring Provider  Steffanie Rainwater DPM.    Onset Date/Surgical Date  -- August 2017.      Precautions   Precautions  None      Restrictions   Weight Bearing Restrictions  No      Balance Screen   Has the patient fallen in the past 6 months  No    Has the patient had a decrease in activity level because of a fear of falling?   No    Is  the patient reluctant to leave their home because of a fear of falling?   No      Prior Function   Level of Independence  Independent      Posture/Postural Control   Posture/Postural Control  No significant limitations      ROM / Strength   AROM / PROM / Strength  AROM;Strength      AROM   Overall AROM Comments  Left ankle dorsiflexion with knee extended and flexed= 5 degrees; right dorsiflexion with knee extended= 5 degrees and with knee flexed= 9 degrees.      Strength   Overall Strength Comments  Normal bilateral foot strength.      Palpation   Palpation comment  Tender to palpation over bilateral MCT regions and radiating over the plantar fascia bilaterally.      Ambulation/Gait   Gait Comments  Normal gait cycle upon presentation to the clinic this morning.  Objective measurements completed on examination: See above findings.      [facility] Adult PT Treatment/Exercise - 03/25/17 0001      Modalities   Modalities  Electrical Stimulation      Electrical Stimulation   Electrical Stimulation Location  Bilateral plantar surface of feet.    Electrical Stimulation Action  Pre-mod.    Electrical Stimulation Parameters  Constant at 80-150 Hz x 15 minutes.    Electrical Stimulation Goals  Pain                  PT Long Term Goals - 03/25/17 1241      PT LONG TERM GOAL #1   Title  Ind with HEP.    Time  6    Period  Weeks    Status  New      PT LONG TERM GOAL #2   Title  Bialteral active dorsiflexion to 10 degrees with knees extended and 15 degrees with knee flexed.    Time  6    Period  Weeks    Status  New      PT LONG TERM GOAL #3   Title  Morning pain-level not to exceed 2/10.    Time  6    Period  Weeks    Status  New             Plan - 03/25/17 1239    Clinical Impression Statement  The patient has bilateral plantar fasciitis with decreased ankle range of motion bilaterally.  She is in a lot of pain upon first rising in the  morning. Patient will benefit from skilled physical therapy intervention.    Clinical Presentation  Evolving    Clinical Decision Making  Low    Rehab Potential  Good    PT Frequency  2x / week    PT Duration  6 weeks    PT Treatment/Interventions  Cryotherapy;Electrical Stimulation;Ultrasound;Moist Heat;Therapeutic activities;Therapeutic exercise;Manual techniques;Passive range of motion;Dry needling;Vasopneumatic Device    PT Next Visit Plan  U/S; IASTM; Rockerboard.  Gastroc-Soleus stretch for HEP.  E'stim.       Patient will benefit from skilled therapeutic intervention in order to improve the following deficits and impairments:  Pain, Decreased range of motion  Visit Diagnosis: Pain in left foot - Plan: PT plan of care cert/re-cert  Pain in right foot - Plan: PT plan of care cert/re-cert     Problem List There are no active problems to display for this patient.   APPLEGATE, [place] MPT 03/25/2017, 12:44 PM  [facility] [address] [place], [place], [address] Phone: [phone]   Fax:  [phone]  Name: Christine Bryant MRN: [id number] Date of Birth: [birth year]

## 2017-03-27 ENCOUNTER — Ambulatory Visit: Payer: 59 | Admitting: Physical Therapy

## 2017-03-27 ENCOUNTER — Encounter: Payer: Self-pay | Admitting: Physical Therapy

## 2017-03-27 DIAGNOSIS — M79672 Pain in left foot: Secondary | ICD-10-CM | POA: Diagnosis not present

## 2017-03-27 DIAGNOSIS — M79671 Pain in right foot: Secondary | ICD-10-CM

## 2017-03-27 NOTE — Therapy (Signed)
[facility] [place], [place], [address] Phone: [phone]   Fax:  [phone]  Physical Therapy Treatment  Patient Details  Name: Christine Bryant MRN: [id number] Date of Birth: 02-21-1981 Referring Provider: Steffanie Rainwater DPM.   Encounter Date: 03/27/2017  PT End of Session - 03/27/17 1820    Visit Number  2    Number of Visits  12    Date for PT Re-Evaluation  05/06/17    PT Start Time  0452    PT Stop Time  0542    PT Time Calculation (min)  50 min    Activity Tolerance  Patient tolerated treatment well    Behavior During Therapy  [facility] for tasks assessed/performed       Past Medical History:  Diagnosis Date  . Anemia     Past Surgical History:  Procedure Laterality Date  . CAROTID BODY TUMOR EXCISION     benign tumor removed    There were no vitals filed for this visit.  Subjective Assessment - 03/27/17 1821    Subjective  I felt sore after that treatment but better.    Pain Score  5     Pain Location  Foot    Pain Orientation  Right;Left    Pain Descriptors / Indicators  Aching;Throbbing    Pain Type  Chronic pain    Pain Onset  More than a month ago                                   PT Long Term Goals - 03/25/17 1241      PT LONG TERM GOAL #1   Title  Ind with HEP.    Time  6    Period  Weeks    Status  New      PT LONG TERM GOAL #2   Title  Bialteral active dorsiflexion to 10 degrees with knees extended and 15 degrees with knee flexed.    Time  6    Period  Weeks    Status  New      PT LONG TERM GOAL #3   Title  Morning pain-level not to exceed 2/10.    Time  6    Period  Weeks    Status  New            Plan - 03/27/17 1821    Clinical Impression Statement  Great response to IASTM today.       Patient will benefit from skilled therapeutic intervention in order to improve the following deficits and impairments:  Pain, Decreased range of  motion  Visit Diagnosis: Pain in left foot  Pain in right foot     Problem List There are no active problems to display for this patient.  Treatment:  Rockerboard in parallel bars x 5 minutes f/b IASTM to bilateral feet (plantar surface) 10 minute each foot (20 minutes total) f/b pre-mod to each foot x 20 minutes.  Excellent response to treatment today.  Bert Ptacek, [place] MPT 03/27/2017, 6:23 PM  [facility] [address] [place], [place], [address] Phone: [phone]   Fax:  [phone]  Name: Christine Bryant MRN: [id number] Date of Birth: [birth year]

## 2017-04-02 ENCOUNTER — Encounter: Payer: Self-pay | Admitting: Physical Therapy

## 2017-04-02 ENCOUNTER — Ambulatory Visit: Payer: 59 | Admitting: Physical Therapy

## 2017-04-02 DIAGNOSIS — M79671 Pain in right foot: Secondary | ICD-10-CM

## 2017-04-02 DIAGNOSIS — M79672 Pain in left foot: Secondary | ICD-10-CM | POA: Diagnosis not present

## 2017-04-02 NOTE — Therapy (Signed)
[facility] [place], [place], [address] Phone: [phone]   Fax:  [phone]  Physical Therapy Treatment  Patient Details  Name: Christine Bryant MRN: [id number] Date of Birth: [birth year] Referring Provider: Steffanie Rainwater DPM.   Encounter Date: 04/02/2017  PT End of Session - 04/02/17 0744    Visit Number  3    Number of Visits  12    Date for PT Re-Evaluation  05/06/17    Authorization Type  FOTO....every 5th visit.    PT Start Time  0741    PT Stop Time  0839    PT Time Calculation (min)  58 min    Activity Tolerance  Patient tolerated treatment well    Behavior During Therapy  [facility] for tasks assessed/performed       Past Medical History:  Diagnosis Date  . Anemia     Past Surgical History:  Procedure Laterality Date  . CAROTID BODY TUMOR EXCISION     benign tumor removed    There were no vitals filed for this visit.  Subjective Assessment - 04/02/17 0742    Subjective  Reports that resting makes the pain worse and she didn't have time to rest yesterday thus she feels her feet are a little better.    Patient Stated Goals  Get out of pain.    Currently in Pain?  Yes    Pain Score  4     Pain Location  Foot    Pain Orientation  Left;Right    Pain Descriptors / Indicators  Discomfort    Pain Type  Chronic pain    Pain Onset  More than a month ago         [facility] PT Assessment - 04/02/17 0001      Assessment   Medical Diagnosis  Bilateral plantar fasciitis.    Next MD Visit  None      Precautions   Precautions  None      Restrictions   Weight Bearing Restrictions  No                  [facility] Adult PT Treatment/Exercise - 04/02/17 0001      Exercises   Exercises  Ankle      Modalities   Modalities  Electrical Stimulation;Moist Heat      Moist Heat Therapy   Number Minutes Moist Heat  15 Minutes    Moist Heat Location  Ankle      Electrical Stimulation   Electrical Stimulation  Location  Bilateral plantar surface of feet.    Electrical Stimulation Action  Pre-Mod    Electrical Stimulation Parameters  80-150 hz x15 min    Electrical Stimulation Goals  Pain      Manual Therapy   Manual Therapy  Myofascial release    Myofascial Release  IASTW to B PF to reduce tone and pain      Ankle Exercises: Stretches   Plantar Fascia Stretch  2 reps;60 seconds BLE    Soleus Stretch  3 reps;30 seconds BLE    Slant Board Stretch  Other (comment) x3 mi    Other Stretch  Rockerboard x5 min                  PT Long Term Goals - 03/25/17 1241      PT LONG TERM GOAL #1   Title  Ind with HEP.    Time  6    Period  Weeks  Status  New      PT LONG TERM GOAL #2   Title  Bialteral active dorsiflexion to 10 degrees with knees extended and 15 degrees with knee flexed.    Time  6    Period  Weeks    Status  New      PT LONG TERM GOAL #3   Title  Morning pain-level not to exceed 2/10.    Time  6    Period  Weeks    Status  New            Plan - 04/02/17 0851    Clinical Impression Statement  Patient presented in clinic with reports of improvement when she is busy and with stretches. Patient compliant with all stretches and required moderate multimodal cueing for stretch technique. Good response to IASTW without complaint of any increased sensivity. Normal modalities response noted following removal of the modalities. Patient encouraged to continue her stretches at home.    Rehab Potential  Good    PT Frequency  2x / week    PT Duration  6 weeks    PT Treatment/Interventions  Cryotherapy;Electrical Stimulation;Ultrasound;Moist Heat;Therapeutic activities;Therapeutic exercise;Manual techniques;Passive range of motion;Dry needling;Vasopneumatic Device    PT Next Visit Plan  U/S; IASTM; Rockerboard.  Gastroc-Soleus stretch for HEP.  E'stim.    Consulted and Agree with Plan of Care  Patient       Patient will benefit from skilled therapeutic intervention in  order to improve the following deficits and impairments:  Pain, Decreased range of motion  Visit Diagnosis: Pain in left foot  Pain in right foot     Problem List There are no active problems to display for this patient.   Standley Brooking, PTA 04/02/2017, 8:56 AM  [facility] [address] [place], [place], [address] Phone: [phone]   Fax:  [phone]  Name: Christine Bryant MRN: [id number] Date of Birth: 05-26-1980

## 2017-04-08 ENCOUNTER — Encounter: Payer: 59 | Admitting: Physical Therapy

## 2017-04-17 ENCOUNTER — Encounter: Payer: Self-pay | Admitting: Physical Therapy

## 2017-04-17 ENCOUNTER — Ambulatory Visit: Payer: 59 | Attending: Podiatry | Admitting: Physical Therapy

## 2017-04-17 DIAGNOSIS — M79671 Pain in right foot: Secondary | ICD-10-CM | POA: Diagnosis present

## 2017-04-17 DIAGNOSIS — M79672 Pain in left foot: Secondary | ICD-10-CM

## 2017-04-17 NOTE — Therapy (Signed)
[facility] [place], [place], [address] Phone: [phone]   Fax:  [phone]  Physical Therapy Treatment  Patient Details  Name: Christine Bryant MRN: [id number] Date of Birth: 06/17/1980 Referring Provider: Steffanie Rainwater DPM.   Encounter Date: 04/17/2017  PT End of Session - 04/17/17 1753    Visit Number  4    Number of Visits  12    Date for PT Re-Evaluation  05/06/17    Authorization Type  FOTO....every 5th visit.    PT Start Time  0448    PT Stop Time  0544    PT Time Calculation (min)  56 min    Activity Tolerance  Patient tolerated treatment well    Behavior During Therapy  WFL for tasks assessed/performed       Past Medical History:  Diagnosis Date  . Anemia     Past Surgical History:  Procedure Laterality Date  . CAROTID BODY TUMOR EXCISION     benign tumor removed    There were no vitals filed for this visit.  Subjective Assessment - 04/17/17 1754    Subjective  I definitely feel like my feet are better.  There have been times when I expect I should have pain and then I realize they don't hurt much.    Patient Stated Goals  Get out of pain.    Pain Score  3     Pain Location  Foot    Pain Orientation  Right;Left    Pain Descriptors / Indicators  Discomfort    Pain Type  Chronic pain    Pain Onset  More than a month ago                      [facility] Adult PT Treatment/Exercise - 04/17/17 0001      Modalities   Modalities  Electrical Stimulation;Moist Heat;Ultrasound      Moist Heat Therapy   Number Minutes Moist Heat  20 Minutes    Moist Heat Location  -- Bilateral feet.      Acupuncturist Location  Bilateral plantar surface of feet.    Electrical Stimulation Action  Pre-mod.    Electrical Stimulation Parameters  80-150 Hz x 20 minutes.    Electrical Stimulation Goals  Pain      Ultrasound   Ultrasound Location  Bilateral plantar surface of  feet.    Ultrasound Parameters  1.50 W/CM2 x 6 minutes each foot (12 minutes total).      Manual Therapy   Manual Therapy  Soft tissue mobilization    Myofascial Release  IASTM x 6 minutes to each foot (plantar surface) 12 minutes total with manual calf stretching.                  PT Long Term Goals - 03/25/17 1241      PT LONG TERM GOAL #1   Title  Ind with HEP.    Time  6    Period  Weeks    Status  New      PT LONG TERM GOAL #2   Title  Bialteral active dorsiflexion to 10 degrees with knees extended and 15 degrees with knee flexed.    Time  6    Period  Weeks    Status  New      PT LONG TERM GOAL #3   Title  Morning pain-level not to exceed 2/10.    Time  6  Period  Weeks    Status  New            Plan - 04/17/17 1802    Clinical Impression Statement  Good response to treatment.  Pain more localized to bilateral medical calcaneal tubercles.    PT Treatment/Interventions  Cryotherapy;Electrical Stimulation;Ultrasound;Moist Heat;Therapeutic activities;Therapeutic exercise;Manual techniques;Passive range of motion;Dry needling;Vasopneumatic Device    Consulted and Agree with Plan of Care  Patient       Patient will benefit from skilled therapeutic intervention in order to improve the following deficits and impairments:  Pain, Decreased range of motion  Visit Diagnosis: Pain in left foot  Pain in right foot     Problem List There are no active problems to display for this patient.   Shakoya Gilmore, [place] MPT 04/17/2017, 6:04 PM  [facility] [address] [place], [place], [address] Phone: [phone]   Fax:  [phone]  Name: Byrdie Miyazaki MRN: [id number] Date of Birth: [birth year]

## 2017-04-24 ENCOUNTER — Encounter: Payer: 59 | Admitting: Physical Therapy

## 2017-05-01 ENCOUNTER — Ambulatory Visit: Payer: 59 | Admitting: Physical Therapy

## 2017-05-01 ENCOUNTER — Encounter: Payer: Self-pay | Admitting: Physical Therapy

## 2017-05-01 DIAGNOSIS — M79672 Pain in left foot: Secondary | ICD-10-CM

## 2017-05-01 DIAGNOSIS — M79671 Pain in right foot: Secondary | ICD-10-CM

## 2017-05-01 NOTE — Therapy (Addendum)
[facility] [place], [place], [address] Phone: [phone]   Fax:  [phone]  Physical Therapy Treatment  Patient Details  Name: Christine Bryant MRN: [id number] Date of Birth: 07-Sep-1980 Referring Provider: Steffanie Rainwater DPM.   Encounter Date: 05/01/2017  PT End of Session - 05/01/17 1704    Visit Number  5    Number of Visits  12    Date for PT Re-Evaluation  05/06/17    Authorization Type  FOTO....every 5th visit.    PT Start Time  1655    PT Stop Time  1750    PT Time Calculation (min)  55 min    Activity Tolerance  Patient tolerated treatment well    Behavior During Therapy  WFL for tasks assessed/performed       Past Medical History:  Diagnosis Date  . Anemia     Past Surgical History:  Procedure Laterality Date  . CAROTID BODY TUMOR EXCISION     benign tumor removed    There were no vitals filed for this visit.  Subjective Assessment - 05/01/17 1658    Subjective  Reports that since missing last week due to conflicts she has had increased pain. Reports more pain after sitting still for prolonged time. Greater pain in L foot than R.    Patient Stated Goals  Get out of pain.    Currently in Pain?  Yes    Pain Score  2     Pain Location  Foot    Pain Orientation  Right;Left    Pain Descriptors / Indicators  Discomfort    Pain Type  Chronic pain    Pain Onset  More than a month ago    Pain Frequency  Intermittent    Aggravating Factors   prolonged still position         [facility] PT Assessment - 05/01/17 0001      Assessment   Medical Diagnosis  Bilateral plantar fasciitis.    Next MD Visit  None      Precautions   Precautions  None      Restrictions   Weight Bearing Restrictions  No                  [facility] Adult PT Treatment/Exercise - 05/01/17 0001      Modalities   Modalities  Electrical Stimulation;Moist Heat;Ultrasound      Moist Heat Therapy   Number Minutes Moist Heat  15  Minutes    Moist Heat Location  Ankle      Electrical Stimulation   Electrical Stimulation Location  B PF    Electrical Stimulation Action  Pre-Mod    Electrical Stimulation Parameters  80-150 hz x15 min    Electrical Stimulation Goals  Pain      Ultrasound   Ultrasound Location  B PF    Ultrasound Parameters  1.5 w/cm2, 100%, 1 mhz x10 m    Ultrasound Goals  Pain      Manual Therapy   Manual Therapy  Myofascial release    Myofascial Release  IASTW to B PF to reduce PF tightness                  PT Long Term Goals - 03/25/17 1241      PT LONG TERM GOAL #1   Title  Ind with HEP.    Time  6    Period  Weeks    Status  New  PT LONG TERM GOAL #2   Title  Bialteral active dorsiflexion to 10 degrees with knees extended and 15 degrees with knee flexed.    Time  6    Period  Weeks    Status  New      PT LONG TERM GOAL #3   Title  Morning pain-level not to exceed 2/10.    Time  6    Period  Weeks    Status  New            Plan - 05/01/17 1758    Clinical Impression Statement  Patient arrived to treatment with reports of continuing B foot pain especially after prlonged inactivity with the L foot worse than the R foot. Patient responded well overall to treatment with stretches as well as IASTW. Muscle tighness of the L PF palpable compared to LLE. Normal modalities response noted following removal of the modalities.    Rehab Potential  Good    PT Frequency  2x / week    PT Duration  6 weeks    PT Treatment/Interventions  Cryotherapy;Electrical Stimulation;Ultrasound;Moist Heat;Therapeutic activities;Therapeutic exercise;Manual techniques;Passive range of motion;Dry needling;Vasopneumatic Device    PT Next Visit Plan  FOTO next week.    Consulted and Agree with Plan of Care  Patient       Patient will benefit from skilled therapeutic intervention in order to improve the following deficits and impairments:  Pain, Decreased range of motion  Visit  Diagnosis: Pain in left foot  Pain in right foot     Problem List There are no active problems to display for this patient.   Standley Brooking, PTA 05/01/2017, 6:11 PM  [facility] [address] [place], [place], [address] Phone: [phone]   Fax:  [phone]  Name: Mandisa Persinger MRN: [id number] Date of Birth: June 20, 1980  PHYSICAL THERAPY DISCHARGE SUMMARY  Visits from Start of Care: 5.  Current functional level related to goals / functional outcomes: See above.   Remaining deficits: See below.   Education / Equipment: HEP. Plan: Patient agrees to discharge.  Patient goals were not met. Patient is being discharged due to not returning since the last visit.  ?????         [place] Applegate MPT

## 2017-07-02 IMAGING — US US MFM OB FOLLOW-UP
1 series · 14 of 28 positions shown · non-contrast
Comparison: none

[Series 1: us mfm ob follow-up · 14 of 35 slices shown]
[im 2/35]
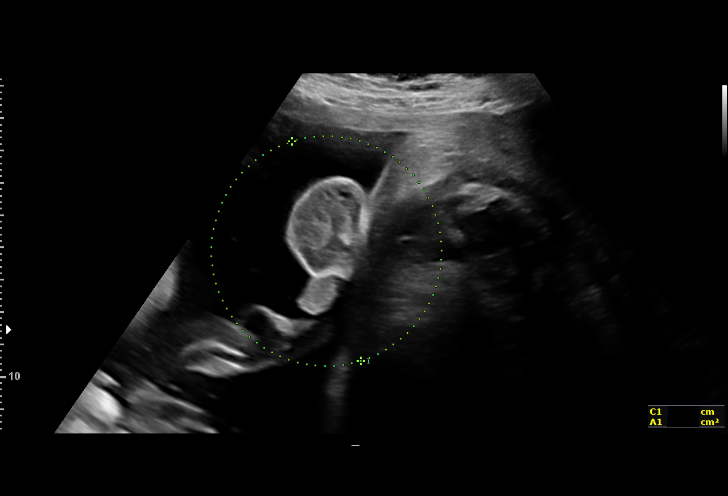
[im 4/35]
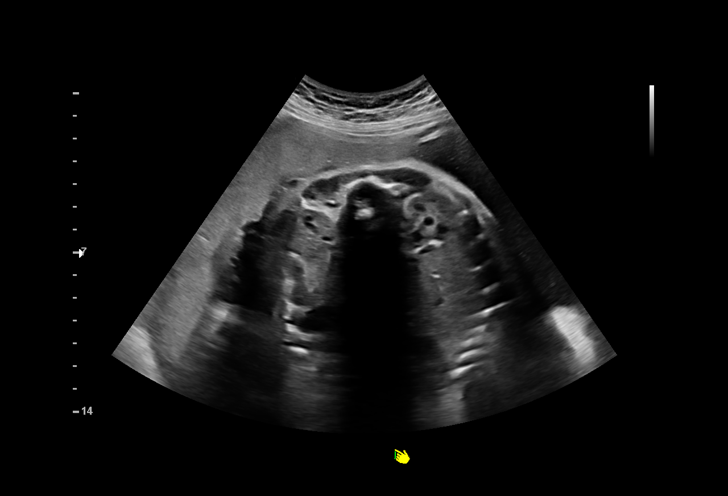
[im 7/35]
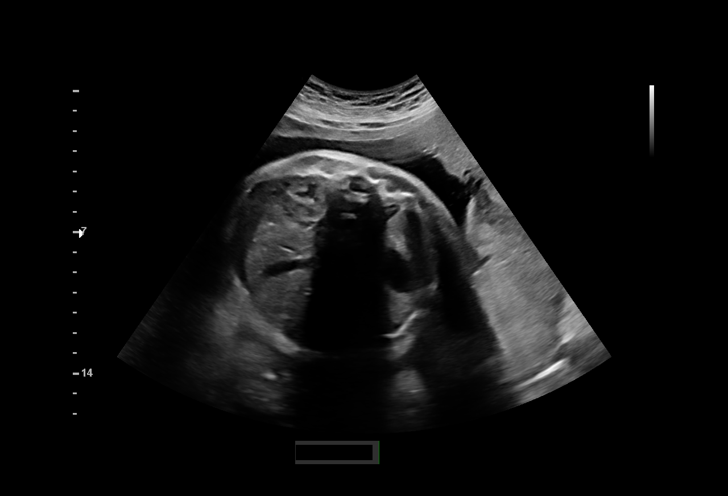
[im 9/35]
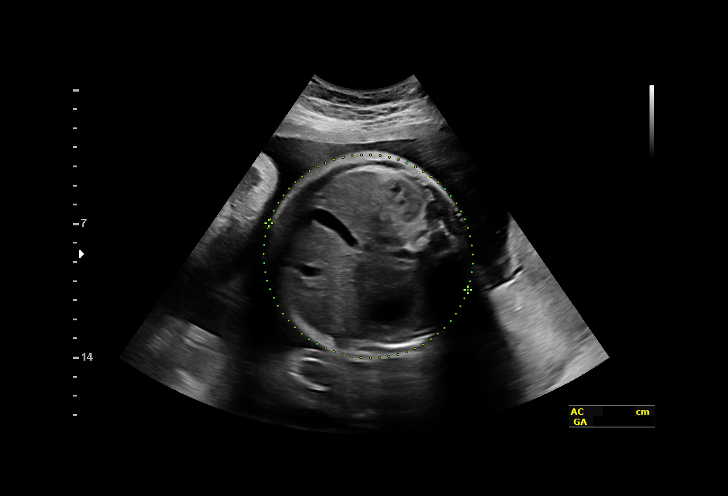
[im 12/35]
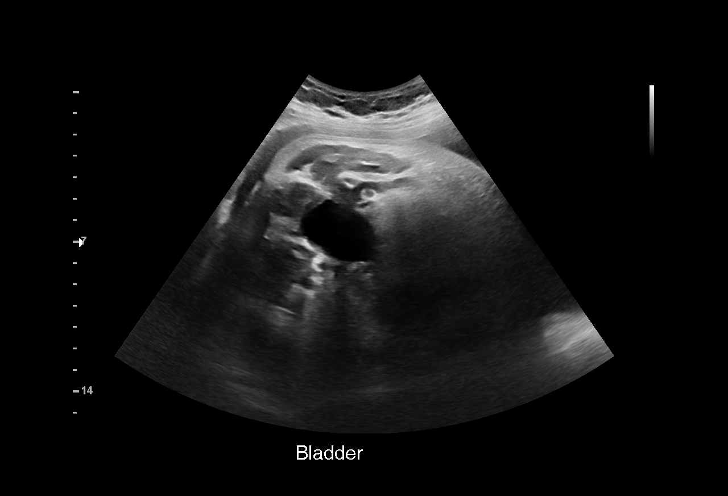
[im 14/35]
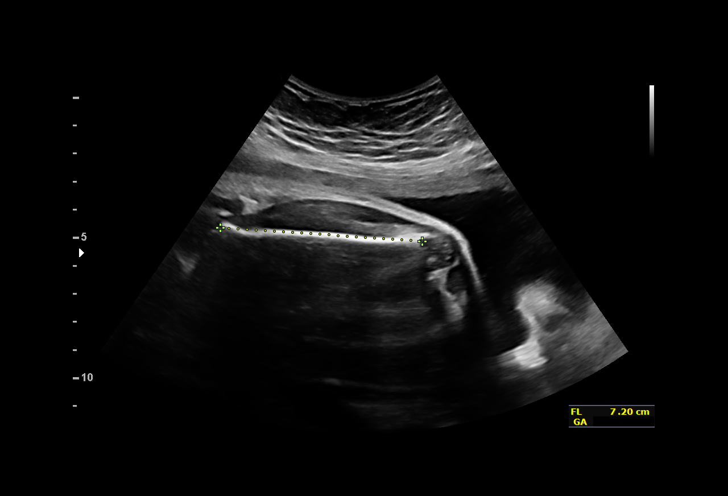
[im 17/35]
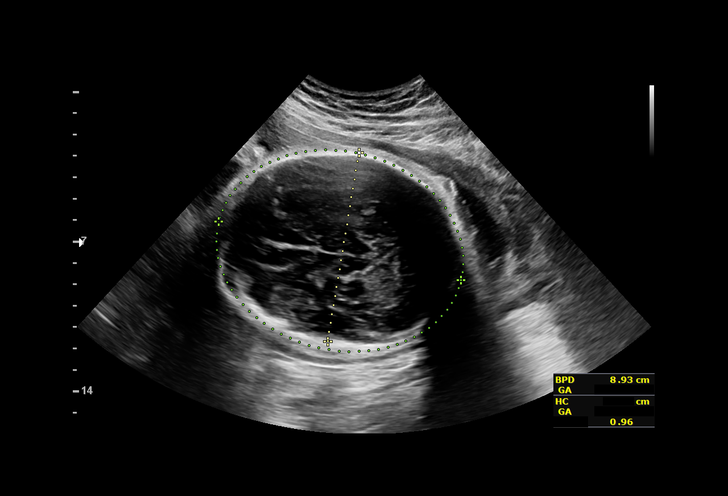
[im 19/35]
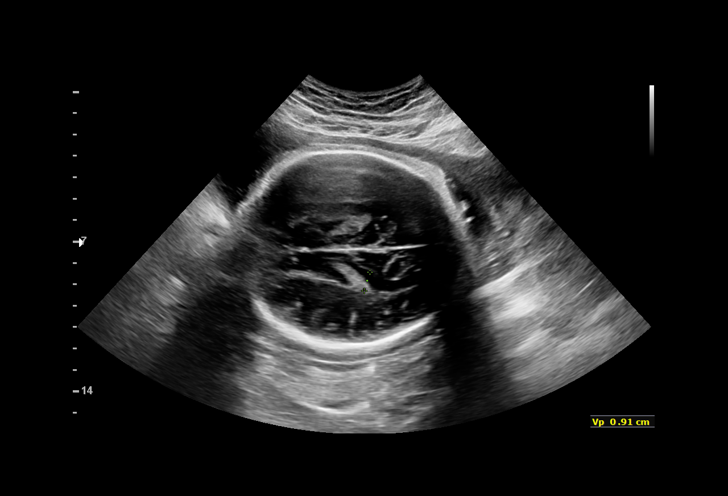
[im 22/35]
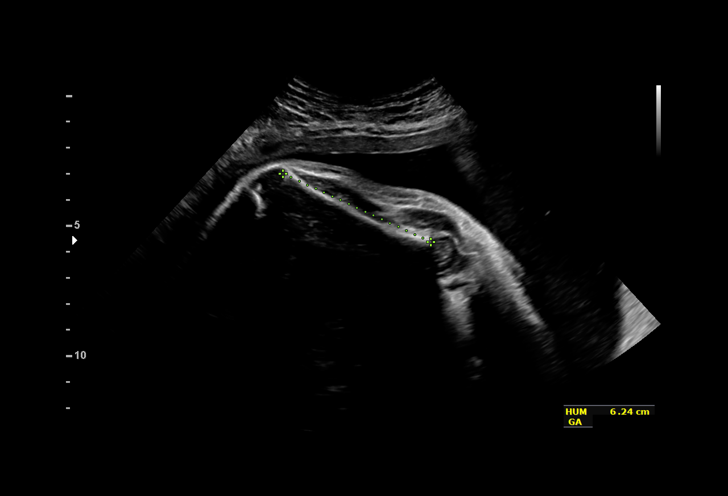
[im 24/35]
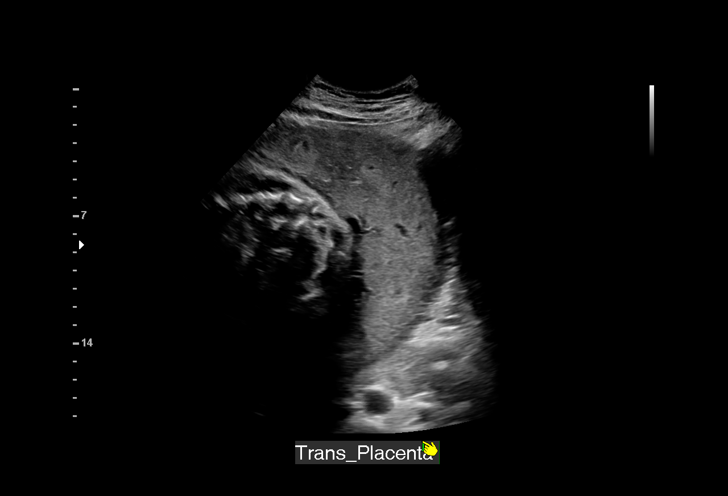
[im 27/35]
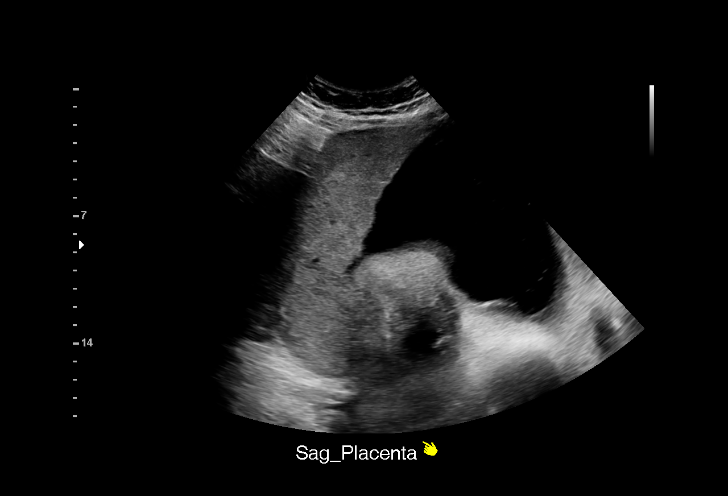
[im 29/35]
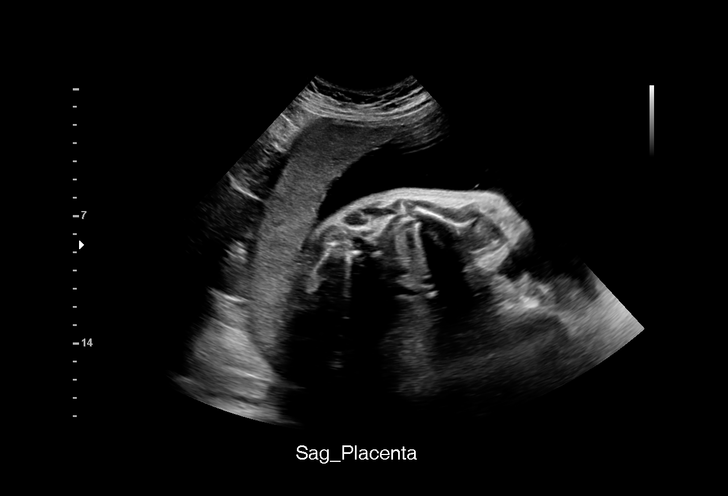
[im 32/35]
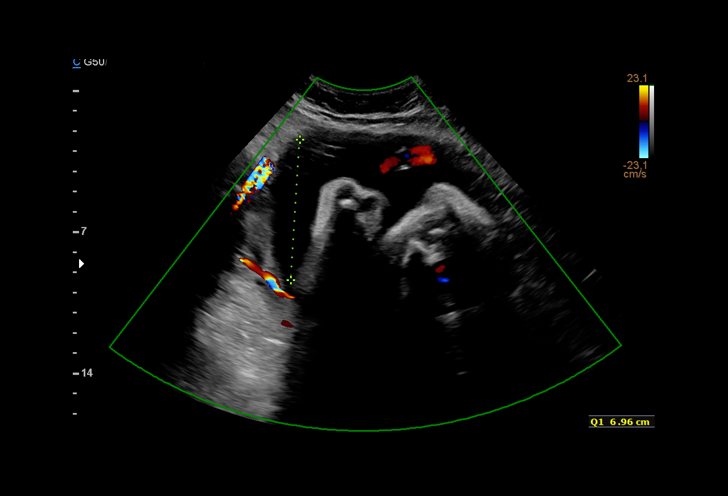
[im 35/35]
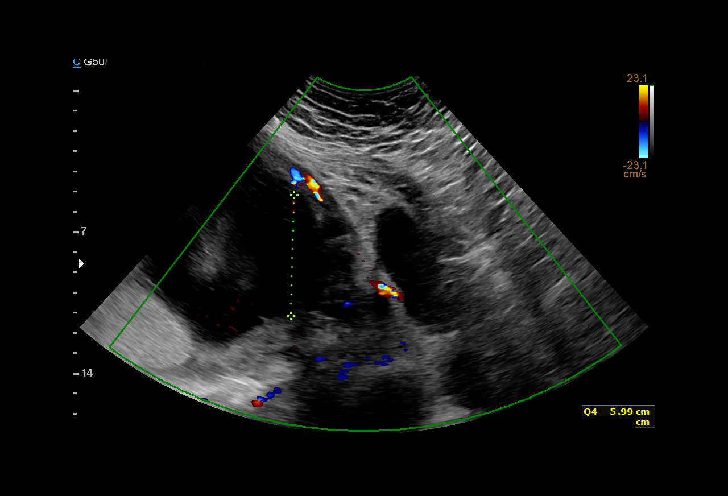

[14 of 28 positions shown; findings below may reference images not displayed]

for [REDACTED]care

Indications

33 weeks gestation of pregnancy
Encounter for other antenatal screening
follow-up
Advanced maternal age multigravida 35+,
second trimester; declined testing
OB History

Blood Type:            Height:  5'10"  Weight (lb):  217      BMI:
Gravidity:    3         Term:   2        Prem:   0        SAB:   0
TOP:          0       Ectopic:  0        Living: 2
Fetal Evaluation

Num Of Fetuses:     1
Fetal Heart         161
Rate(bpm):
Cardiac Activity:   Observed
Presentation:       Cephalic
Placenta:           Posterior, above cervical os
P. Cord Insertion:  Previously Visualized

Amniotic Fluid
AFI FV:      Subjectively within normal limits

AFI Sum(cm)     %Tile       Largest Pocket(cm)
24.41           94
RUQ(cm)       RLQ(cm)       LUQ(cm)        LLQ(cm)
6.96
Biometry

BPD:      90.6  mm     G. Age:  36w 5d       > 99  %    CI:        73.19   %   70 - 86
FL/HC:      21.4   %   19.9 -
HC:      336.6  mm     G. Age:  38w 4d       > 97  %    HC/AC:      0.98       0.96 -
AC:      342.7  mm     G. Age:  38w 1d       > 97  %    FL/BPD:     79.6   %   71 - 87
FL:       72.1  mm     G. Age:  37w 0d       > 97  %    FL/AC:      21.0   %   20 - 24
HUM:      61.2  mm     G. Age:  35w 3d       > 95  %
Est. FW:    4869  gm      7 lb 4 oz   > 90  %
Gestational Age

LMP:           35w 6d       Date:   12/16/15                 EDD:   09/21/16
U/S Today:     37w 4d                                        EDD:   09/09/16
Best:          33w 3d    Det. By:   Early Ultrasound         EDD:   10/08/16
(02/29/16)
Anatomy

Cranium:               Appears normal         Aortic Arch:            Previously seen
Cavum:                 Previously seen        Ductal Arch:            Previously seen
Ventricles:            Appears normal         Diaphragm:              Previously seen
Choroid Plexus:        Previously seen        Stomach:                Appears normal, left
sided
Cerebellum:            Previously seen        Abdomen:                Appears normal
Posterior Fossa:       Previously seen        Abdominal Wall:         Previously seen
Nuchal Fold:           Previously seen        Cord Vessels:           Previously seen
Face:                  Orbits and profile     Kidneys:                Appear normal
previously seen
Lips:                  Previously seen        Bladder:                Appears normal
Thoracic:              Previously seen        Spine:                  Previously seen
Heart:                 Previously seen        Upper Extremities:      Previously seen
RVOT:                  Previously seen        Lower Extremities:      Previously seen
LVOT:                  Previously seen

Other:  Fetus appears to be a male. Heels and 5th digit previously visualized.
Nasal bone previously visualized.
Cervix Uterus Adnexa

Cervix
Not visualized (advanced GA >25wks)

Uterus
No abnormality visualized.

Left Ovary
Previously seen.

Right Ovary
Previously seen
Impression

Single IUP at 33w 3d
Advanced maternal age
The estimated fetal weight is > 90th %tile (4869 g).  The AC
measures > 97th %tile.
Posterior placenta without previa
Normal amniotic fluid volume
Recommendations

Consider ultrasound for growth 
 37 weeks due to suspected
fetal macrosomia

## 2017-08-05 ENCOUNTER — Encounter: Payer: Self-pay | Admitting: Family Medicine

## 2017-08-05 ENCOUNTER — Ambulatory Visit (INDEPENDENT_AMBULATORY_CARE_PROVIDER_SITE_OTHER): Payer: 59 | Admitting: Family Medicine

## 2017-08-05 VITALS — BP 131/89 | HR 95 | Temp 98.7°F | Resp 96 | Ht 70.0 in | Wt 197.0 lb

## 2017-08-05 DIAGNOSIS — R29898 Other symptoms and signs involving the musculoskeletal system: Secondary | ICD-10-CM | POA: Insufficient documentation

## 2017-08-05 DIAGNOSIS — J9801 Acute bronchospasm: Secondary | ICD-10-CM | POA: Diagnosis not present

## 2017-08-05 DIAGNOSIS — Z7689 Persons encountering health services in other specified circumstances: Secondary | ICD-10-CM

## 2017-08-05 DIAGNOSIS — R6889 Other general symptoms and signs: Secondary | ICD-10-CM

## 2017-08-05 DIAGNOSIS — J301 Allergic rhinitis due to pollen: Secondary | ICD-10-CM | POA: Insufficient documentation

## 2017-08-05 LAB — VERITOR FLU A/B WAIVED
INFLUENZA A: NEGATIVE
Influenza B: NEGATIVE

## 2017-08-05 MED ORDER — BENZONATATE 100 MG PO CAPS: 100.0000 mg | ORAL_CAPSULE | Freq: Two times a day (BID) | ORAL | 0 refills | Status: DC | PRN

## 2017-08-05 MED ORDER — ALBUTEROL SULFATE HFA 108 (90 BASE) MCG/ACT IN AERS: 2.0000 | INHALATION_SPRAY | Freq: Four times a day (QID) | RESPIRATORY_TRACT | 0 refills | Status: DC | PRN

## 2017-08-05 NOTE — Progress Notes (Signed)
Subjective: ZO:XWRUEAVWU care, URI HPI: Christine Bryant is a 37 y.o. female presenting to clinic today for:  1. Flu like symptoms  Patient reports sudden onset of cough, sinus congestion, fatigue and fever to 102 F on Tuesday.  She notes that there multiple sick contacts in the home, including her children.  Denies shortness of breath, nausea, vomiting, diarrhea, rash.  No hemoptysis.  She does feel like she has been wheezing and used some old albuterol from several years ago with some relief of symptoms.  She has been using Robitussin with some improvement of chest congestion.  She wanted to make sure that she did not have the flu.  No known lung disease.  Patient has never been a smoker.  She is currently breast feeding.   Past Medical History:  Diagnosis Date  . Anemia    Past Surgical History:  Procedure Laterality Date  . 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 Marital status: Married    Spouse name: Not on file  . Number of children: 3  . Years of education: Not on file  . Highest education level: Not on file  Occupational History  . Occupation: stay at home mom  Social Needs  . Financial resource strain: Not on file  . Food insecurity:    Worry: Not on file    Inability: Not on file  . Transportation needs:    Medical: Not on file    Non-medical: Not on file  Tobacco Use  . Smoking status: Never Smoker  . Smokeless tobacco: Never Used  Substance and Sexual Activity  . Alcohol use: No  . Drug use: No  . Sexual activity: Yes    Birth control/protection: None  Lifestyle  . Physical activity:    Days per week: Not on file    Minutes per session: Not on file  . Stress: Not on file  Relationships  . Social connections:    Talks on phone: Not on file    Gets together: Not on file    Attends religious service: Not on file    Active member of club or organization: Not on file    Attends meetings of clubs  or organizations: Not on file    Relationship status: Not on file  . Intimate partner violence:    Fear of current or ex partner: Not on file    Emotionally abused: Not on file    Physically abused: Not on file    Forced sexual activity: Not on file  Other Topics Concern  . Not on file  Social History Narrative  . Not on file   No outpatient medications have been marked as taking for the 08/05/17 encounter (Office Visit) with Janora Norlander, DO.   Family History  Problem Relation Age of Onset  . Thyroid disease Mother   . Heart attack Father 69  . Healthy Daughter   . Healthy Son   . Diabetes Maternal Grandmother   . Colon cancer Maternal Grandmother 30  . Diabetes Maternal Grandfather   . Heart attack Paternal Grandfather 27  . Diabetes Paternal Grandfather   . Healthy Daughter    No Known Allergies   Health Maintenance: Declines flu, sick  ROS: Per HPI  Objective: Office vital signs reviewed. BP 131/89   Pulse 95   Temp 98.7 F (37.1 C) (Oral)   Resp (!) 96   Ht 5\' 10"  (1.778 m)  Wt 197 lb (89.4 kg)   BMI 28.27 kg/m   Physical Examination:  General: Awake, alert, tired appearing but nontoxic, No acute distress HEENT: Normal    Neck: No masses palpated. No lymphadenopathy    Ears: Tympanic membranes intact, normal light reflex, no erythema, no bulging    Eyes: PERRLA, extraocular movement in tact, sclera white    Nose: nasal turbinates moist, clear nasal discharge    Throat: moist mucus membranes, no erythema, no tonsillar exudate.  Airway is patent Cardio: regular rate and rhythm, S1S2 heard, no murmurs appreciated Pulm: Mild expiratory wheezes noted throughout.  No rhonchi or rales; normal work of breathing on room air  Assessment/ Plan: 37 y.o. female   1. Flu-like symptoms Patient is afebrile nontoxic-appearing with normal vital signs.  Rapid flu was negative.  Likely a viral URI.  She was noted to have mild expiratory wheezes on exam.  I did  offer a steroid injection here in office but patient declined.  I have prescribed her albuterol inhaler to use 2 puffs every 6 hours as needed for wheeze, shortness of breath.  She may then use it as needed as directed.  Push oral fluids.  Tessalon Perles 100 mg p.o. twice daily as needed cough prescribed.  We discussed that it is determined at this time whether or not this is transmitted through breastmilk but it certainly seems safer than products containing codeine.  Patient was good understanding wishes to proceed with use of Tessalon Perles.  Home care instructions were reviewed and a handout was provided.  Patient to follow-up as needed. - Veritor Flu A/B Waived  2. Bronchospasm   3. Establishing care with new doctor, encounter for Pap smear perfomed during last pregnancy.  No prior PCP, was seeing OB.   Janora Norlander, DO [place] [phone]

## 2017-08-05 NOTE — Patient Instructions (Signed)
I prescribed you a cough medication called Tessalon.  No studies have shown this to be transmitted in breast milk but you should use this medication with caution.  It is not sedating and does not contain codeine.  I have refilled your albuterol.  Use 2 puffs every 6 hours for the next 2 days.  Then you may use as needed as directed.  If any of your symptoms are worsening, please seek medical reevaluation.   Upper Respiratory Infection, Adult Most upper respiratory infections (URIs) are caused by a virus. A URI affects the nose, throat, and upper air passages. The most common type of URI is often called "the common cold." Follow these instructions at home:  Take medicines only as told by your doctor.  Gargle warm saltwater or take cough drops to comfort your throat as told by your doctor.  Use a warm mist humidifier or inhale steam from a shower to increase air moisture. This may make it easier to breathe.  Drink enough fluid to keep your pee (urine) clear or pale yellow.  Eat soups and other clear broths.  Have a healthy diet.  Rest as needed.  Go back to work when your fever is gone or your doctor says it is okay. ? You may need to stay home longer to avoid giving your URI to others. ? You can also wear a face mask and wash your hands often to prevent spread of the virus.  Use your inhaler more if you have asthma.  Do not use any tobacco products, including cigarettes, chewing tobacco, or electronic cigarettes. If you need help quitting, ask your doctor. Contact a doctor if:  You are getting worse, not better.  Your symptoms are not helped by medicine.  You have chills.  You are getting more short of breath.  You have brown or red mucus.  You have yellow or brown discharge from your nose.  You have pain in your face, especially when you bend forward.  You have a fever.  You have puffy (swollen) neck glands.  You have pain while swallowing.  You have white areas in  the back of your throat. Get help right away if:  You have very bad or constant: ? Headache. ? Ear pain. ? Pain in your forehead, behind your eyes, and over your cheekbones (sinus pain). ? Chest pain.  You have long-lasting (chronic) lung disease and any of the following: ? Wheezing. ? Long-lasting cough. ? Coughing up blood. ? A change in your usual mucus.  You have a stiff neck.  You have changes in your: ? Vision. ? Hearing. ? Thinking. ? Mood. This information is not intended to replace advice given to you by your health care provider. Make sure you discuss any questions you have with your health care provider. Document Released: 10/16/2007 Document Revised: 12/31/2015 Document Reviewed: 08/04/2013 Elsevier Interactive Patient Education  2018 [employer].

## 2017-08-08 ENCOUNTER — Telehealth: Payer: Self-pay | Admitting: Family Medicine

## 2017-08-08 NOTE — Telephone Encounter (Signed)
Please review and advise.

## 2017-08-08 NOTE — Telephone Encounter (Signed)
If she has been febrile in the last 24 hours, yes.  I would avoid contact with infant entirely.  If she has not had a fever but is still symptomatic, I would still use extreme caution as she may still be shedding the virus and could infect the infant.  Recommend handwashing and not handling the child if possible if she is worried about infecting the child.

## 2017-08-08 NOTE — Telephone Encounter (Signed)
Pt was seen on 08/05/17 with Gottschalk for URI infection. She is suppose to see family tonight with a one month old baby. She wants to know if she is still contagious. Please advise.

## 2017-08-12 NOTE — Telephone Encounter (Signed)
Aware. 

## 2017-11-10 ENCOUNTER — Ambulatory Visit (INDEPENDENT_AMBULATORY_CARE_PROVIDER_SITE_OTHER): Payer: 59 | Admitting: Family Medicine

## 2017-11-10 VITALS — BP 105/73 | HR 65 | Temp 99.0°F | Ht 70.0 in | Wt 200.0 lb

## 2017-11-10 DIAGNOSIS — E663 Overweight: Secondary | ICD-10-CM

## 2017-11-10 DIAGNOSIS — R5383 Other fatigue: Secondary | ICD-10-CM

## 2017-11-10 LAB — BAYER DCA HB A1C WAIVED: HB A1C: 5.1 % (ref <7.0)

## 2017-11-10 NOTE — Progress Notes (Signed)
+   [phone]

## 2017-11-10 NOTE — Progress Notes (Signed)
Subjective: CC: fatigue PCP: Janora Norlander, DO Christine Bryant is a 37 y.o. female presenting to clinic today for:  1. Fatigue Patient reports a long-standing history of fatigue but this seems to be worse lately.  She reports difficulty losing weight, chronic constipation and growth of facial hair.  She notes that she is had issues with facial hair for about 10 years but it seems to be worse now.  She notes that she is had laser treatment for this in the past.  She also notes hair thinning.  Reports that she has had 2 menstrual cycles since the delivery of her child 13 months ago.  This is typical for her postpartum.  She notes that her cycles about 39 days long now.  She is still breast-feeding.  Her mother has a history of thyroid disorder that required partial thyroidectomy.  2.  Overweight Difficulty losing weight as above.  She does report no structured exercise but tends to follow a fairly healthy diet, citing that her husband is currently on weight watchers and she often eats many other things that he eats.  Weight at graduation from high school was 165 pounds.  When she was working out and eating well-balanced diet weight was 180 pounds.  She reports rare binge eating.  She does have occasional ice cream and graham crackers.  Otherwise, diet is comprised of lean meats, fruits and whole grains.   ROS: Per HPI  No Known Allergies Past Medical History:  Diagnosis Date  . Anemia    No current outpatient medications on file. Social History   Socioeconomic History  . Marital status: Married    Spouse name: Not on file  . Number of children: 3  . Years of education: Not on file  . Highest education level: Not on file  Occupational History  . Occupation: stay at home mom  Social Needs  . Financial resource strain: Not on file  . Food insecurity:    Worry: Not on file    Inability: Not on file  . Transportation needs:    Medical: Not on file    Non-medical: Not on  file  Tobacco Use  . Smoking status: Never Smoker  . Smokeless tobacco: Never Used  Substance and Sexual Activity  . Alcohol use: No  . Drug use: No  . Sexual activity: Yes    Birth control/protection: None  Lifestyle  . Physical activity:    Days per week: Not on file    Minutes per session: Not on file  . Stress: Not on file  Relationships  . Social connections:    Talks on phone: Not on file    Gets together: Not on file    Attends religious service: Not on file    Active member of club or organization: Not on file    Attends meetings of clubs or organizations: Not on file    Relationship status: Not on file  . Intimate partner violence:    Fear of current or ex partner: Not on file    Emotionally abused: Not on file    Physically abused: Not on file    Forced sexual activity: Not on file  Other Topics Concern  . Not on file  Social History Narrative  . Not on file   Family History  Problem Relation Age of Onset  . Thyroid disease Mother   . Heart attack Father 27  . Healthy Daughter   . Healthy Son   . Diabetes Maternal Grandmother   .  Colon cancer Maternal Grandmother 16  . Diabetes Maternal Grandfather   . Heart attack Paternal Grandfather 79  . Diabetes Paternal Grandfather   . Healthy Daughter     Objective: Office vital signs reviewed. BP 105/73   Pulse 65   Temp 99 F (37.2 C) (Oral)   Ht 5' 10"  (1.778 m)   Wt 200 lb (90.7 kg)   BMI 28.70 kg/m   Physical Examination:  General: Awake, alert, well nourished, well appearing, No acute distress HEENT: Normal    Neck: No masses palpated. No lymphadenopathy; no palpable thyroid enlargement or nodules.  No goiter.    Eyes: PERRLA, extraocular membranes intact, sclera white, no exophthalmos    Throat: moist mucus membranes. Cardio: regular rate and rhythm, S1S2 heard, no murmurs appreciated Pulm: clear to auscultation bilaterally, no wheezes, rhonchi or rales; normal work of breathing on room  air Extremities: warm, well perfused, No edema, cyanosis or clubbing; +2 pulses bilaterally MSK: Normal gait and  normal station Skin: dry; intact; no rashes or lesions Neuro: No resting tremor appreciated.  Assessment/ Plan: 37 y.o. female   1. Fatigue, unspecified type Possibly related to weight versus underlying metabolic disorder.  Will check metabolic labs including L4T given family history of diabetes and thyroid disorder.  Will contact patient with results tomorrow. - TSH - CBC - CMP14+EGFR - Bayer DCA Hb A1c Waived  2. Overweight (BMI 25.0-29.9) We discussed consideration for referral to nutrition versus nonsurgical weight loss center with [place] versus starting medication.  We discussed that many the medications use for weight loss are not preferred in breast-feeding or if you are planning on pregnancy in the near future.  She will consider her options and contact me when she is made a decision. - TSH - Bayer DCA Hb A1c Waived   Orders Placed This Encounter  Procedures  . TSH  . CBC  . CMP14+EGFR  . Bayer [facility] Hb A1c Waived    [place], [place] [phone]

## 2017-11-10 NOTE — Patient Instructions (Signed)
We discussed the app "lose it" to help with food tracking.  We discussed consideration for referral to nutrition.  We can also consider referring you to [facility] in [place].  A 4-month trial of phentermine may be beneficial to improve energy, increase satiety and help with weight loss.  This would not be a medication you would want to take if you are pregnant or breast-feeding however.  We will contact you with the results of your labs in the next 2 days.    Follow-up with me in about 6 weeks or sooner if needed.  In preparation for our upcoming appointment regarding weight loss, I would like you to answer the following questions on a separate piece of paper and bring them with you to your appointment.   Why do you want to lose weight?  Does obesity run in your family?  What has been your highest weight/ lowest weight in the past?  What did you weigh when you were 37 years old?  What is your goal weight?  What diets/ medications have you tried and did they work?  What side effects did they have if any?  What are your "eating/ binging" triggers?  Have you ever seen a therapist regarding your eating habits?  Are you willing to?  Would you be willing to see a dietician?  Are you willing to meet with me monthly?  Do you have any personal history of thyroid disease (including thyroid cancer), pancreatitis, diabetes, high blood pressure, high cholesterol, heart attack, stroke?  Do you have any family history of thyroid disease (including thyroid cancer), pancreatitis, diabetes, high blood pressure, high cholesterol, heart attack, stroke?  If so, please list who.  Have you ever been diagnosed or had a mental health disorder (addiction, depression, anxiety, bipolar disorder, schizophrenia, etc)?  Is there family history of mental health disorders (addiction, depression, anxiety, bipolar disorder, schizophrenia, etc)?  Would you be interested in bariatric surgery if you were  determined to be a candidate?  Please also bring a 3 day food journal to that appointment.  Document EVERYTHING (including snacks/ candies/ food tastings/ drinks), what time of day you ate them and what you were doing and feeling when you ate/ drink (were you driving/ rushing to get somewhere?  Were you seated at the dinner table/ watching tv?  Were you lonely/ upset/ happy/ celebrating?)

## 2017-11-11 LAB — CMP14+EGFR
A/G RATIO: 1.7 (ref 1.2–2.2)
ALBUMIN: 5.1 g/dL (ref 3.5–5.5)
ALT: 13 IU/L (ref 0–32)
AST: 17 IU/L (ref 0–40)
Alkaline Phosphatase: 86 IU/L (ref 39–117)
BUN / CREAT RATIO: 22 (ref 9–23)
BUN: 14 mg/dL (ref 6–20)
Bilirubin Total: <0.2 mg/dL (ref 0.0–1.2)
CALCIUM: 9.7 mg/dL (ref 8.7–10.2)
CO2: 23 mmol/L (ref 20–29)
Chloride: 102 mmol/L (ref 96–106)
Creatinine, Ser: 0.63 mg/dL (ref 0.57–1.00)
GFR, EST AFRICAN AMERICAN: 133 mL/min/{1.73_m2} (ref >59)
GFR, EST NON AFRICAN AMERICAN: 116 mL/min/{1.73_m2} (ref >59)
GLOBULIN, TOTAL: 3 g/dL (ref 1.5–4.5)
Glucose: 84 mg/dL (ref 65–99)
POTASSIUM: 4.1 mmol/L (ref 3.5–5.2)
Sodium: 140 mmol/L (ref 134–144)
TOTAL PROTEIN: 8.1 g/dL (ref 6.0–8.5)

## 2017-11-11 LAB — CBC
HEMATOCRIT: 38.8 % (ref 34.0–46.6)
HEMOGLOBIN: 13.3 g/dL (ref 11.1–15.9)
MCH: 28.4 pg (ref 26.6–33.0)
MCHC: 34.3 g/dL (ref 31.5–35.7)
MCV: 83 fL (ref 79–97)
PLATELETS: 298 10*3/uL (ref 150–450)
RBC: 4.69 x10E6/uL (ref 3.77–5.28)
RDW: 13.9 % (ref 12.3–15.4)
WBC: 9.2 10*3/uL (ref 3.4–10.8)

## 2017-11-11 LAB — TSH: TSH: 2.24 u[IU]/mL (ref 0.450–4.500)

## 2017-12-02 ENCOUNTER — Encounter: Payer: Self-pay | Admitting: Family Medicine

## 2017-12-06 ENCOUNTER — Emergency Department (HOSPITAL_COMMUNITY): Payer: 59

## 2017-12-06 ENCOUNTER — Encounter (HOSPITAL_COMMUNITY): Payer: Self-pay | Admitting: Emergency Medicine

## 2017-12-06 ENCOUNTER — Emergency Department (HOSPITAL_COMMUNITY)
Admission: EM | Admit: 2017-12-06 | Discharge: 2017-12-06 | Disposition: A | Discharge status: Home / Self Care | Payer: 59 | Attending: Emergency Medicine | Admitting: Emergency Medicine

## 2017-12-06 ENCOUNTER — Other Ambulatory Visit: Payer: Self-pay

## 2017-12-06 DIAGNOSIS — R6884 Jaw pain: Secondary | ICD-10-CM | POA: Insufficient documentation

## 2017-12-06 MED ORDER — ACETAMINOPHEN 325 MG PO TABS
650.0000 mg | ORAL_TABLET | Freq: Once | ORAL | Status: AC
Start: 2017-12-06 — End: 2017-12-06
  Administered 2017-12-06: 650 mg via ORAL
  Filled 2017-12-06: qty 2

## 2017-12-06 NOTE — ED Provider Notes (Signed)
MSE was initiated and I personally evaluated the patient and placed orders (if any) at  9:41 AM on December 06, 2017.  The patient appears stable so that the remainder of the MSE may be completed by another provider.  Patient presents today for evaluation of feeling like her left-sided jaw is out of place.  She has a long-standing history of TMJ and can normally pop it back and by moving her jaw side to side.  She is unable to open her mouth more than 1 to 2 cm secondary to pain on the left side.  She has never had it be this bad before.  She denies any trauma, states that she woke up with it like this.  Patient reports she is approximately [redacted] weeks pregnant, has not seen an OB/GYN yet.   Lorin Glass, [place] 12/06/17 [id number]    Tegeler, Gwenyth Allegra, MD 12/06/17 1550

## 2017-12-06 NOTE — Discharge Instructions (Addendum)
Tylenol and ice for pain. See your dentist next week.

## 2017-12-06 NOTE — ED Triage Notes (Signed)
Pt has hx of TMJ and usually able to get jaw back in when pops out but this time cannot get it back in place. [redacted] weeks pregnant.

## 2017-12-06 NOTE — ED Provider Notes (Signed)
[facility] Provider Note   CSN: [id number] Arrival date & time: 12/06/17  0626     History   Chief Complaint Chief Complaint  Patient presents with  . Jaw Pain    HPI Christine Bryant is a 37 y.o. female.  Patient presents with left jaw pain since this morning.  No direct injury recalled.  Patient has had history of subluxation.  No fevers or chills.  Mild pain with opening.  Patient can chew with mild discomfort.  No recent dental issues.     Past Medical History:  Diagnosis Date  . Anemia     Patient Active Problem List   Diagnosis Date Noted  . TMJ click 94/85/4627  . Hay fever 08/05/2017  . 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 Lesion of pinna, left 08/31/2015  . Vitamin D insufficiency 07/27/2011    Past Surgical History:  Procedure Laterality Date  . CAROTID BODY TUMOR EXCISION     benign tumor removed     OB History    Gravida  3   Para  3   Term  3   Preterm      AB      Living  3     SAB      TAB      Ectopic      Multiple  0   Live Births  3            Home Medications    Prior to Admission medications   Not on File    Family History Family History  Problem Relation Age of Onset  . Thyroid disease Mother   . Heart attack Father 69  . Healthy Daughter   . Healthy Son   . Diabetes Maternal Grandmother   . Colon cancer Maternal Grandmother 24  . Diabetes Maternal Grandfather   . Heart attack Paternal Grandfather 25  . Diabetes Paternal Grandfather   . Healthy Daughter     Social History Social History   Tobacco Use  . Smoking status: Never Smoker  . Smokeless tobacco: Never Used  Substance Use Topics  . Alcohol use: No  . Drug use: No     Allergies   Patient has no known allergies.   Review of Systems Review of Systems  Constitutional: Negative for chills and fever.  Eyes: Negative for visual disturbance.  Respiratory: Negative for shortness of  breath.   Cardiovascular: Negative for chest pain.  Gastrointestinal: Negative for abdominal pain and vomiting.  Musculoskeletal: Negative for back pain, neck pain and neck stiffness.  Skin: Negative for rash.  Neurological: Negative for headaches.     Physical Exam Updated Vital Signs BP 124/79 (BP Location: Right Arm)   Pulse 68   Temp 98.5 F (36.9 C) (Oral)   Resp 20   Ht 5\' 10"  (1.778 m)   Wt 90.7 kg (200 lb)   SpO2 100%   BMI 28.70 kg/m   Physical Exam  Constitutional: She appears well-developed and well-nourished. No distress.  HENT:  Head: Normocephalic and atraumatic.  Patient has mild tenderness to palpation of left TMJ.  Patient has no trismus.  No external signs of infection.  Neck supple.  No lymphadenopathy.  Mild discomfort with opening mouth.  Eyes: Pupils are equal, round, and reactive to light. EOM are normal.  Neck: Neck supple.  Cardiovascular: Normal rate.  Pulmonary/Chest: Effort normal.  Nursing note and vitals reviewed.    ED Treatments / Results  Labs (all labs ordered are listed, but only abnormal results are displayed) Labs Reviewed - No data to display  EKG None  Radiology Dg Orthopantogram  Result Date: 12/06/2017 CLINICAL DATA:  Acute LEFT jaw/TMJ pain for 1 day. History of prior jaw dislocations. EXAM: ORTHOPANTOGRAM/PANORAMIC COMPARISON:  None. FINDINGS: No acute fracture or dislocation. The TMJs are unremarkable radiographically. No suspicious focal bony lesions are present. IMPRESSION: Unremarkable exam. Consider elective MRI of the TMJs as clinically indicated. Electronically Signed   By: Margarette Canada M.D.   On: 12/06/2017 11:12    Procedures Procedures (including critical care time)  Medications Ordered in ED Medications  acetaminophen (TYLENOL) tablet 650 mg (650 mg Oral Given 12/06/17 1046)     Initial Impression / Assessment and Plan / ED Course  I have reviewed the triage vital signs and the nursing notes.  Pertinent  labs & imaging results that were available during my care of the patient were reviewed by me and considered in my medical decision making (see chart for details).    Patient presents with clinical concern for subluxation/TMJ syndrome.  X-ray performed due to worsening symptoms and subluxation history.  X-ray no acute fracture or dislocation.  Discussed Tylenol, ice and supportive care with dental follow-up.  Results and differential diagnosis were discussed with the patient/parent/guardian. Xrays were independently reviewed by myself.  Close follow up outpatient was discussed, comfortable with the plan.   Medications  acetaminophen (TYLENOL) tablet 650 mg (650 mg Oral Given 12/06/17 1046)    Vitals:   12/06/17 0934 12/06/17 1000  BP: 124/79   Pulse: 68   Resp: 20   Temp: 98.5 F (36.9 C)   TempSrc: Oral   SpO2: 100%   Weight:  90.7 kg (200 lb)  Height:  5\' 10"  (1.778 m)    Final diagnoses:  Jaw pain     Final Clinical Impressions(s) / ED Diagnoses   Final diagnoses:  Jaw pain    ED Discharge Orders    None       Elnora Morrison, MD 12/06/17 1238

## 2017-12-26 ENCOUNTER — Ambulatory Visit (INDEPENDENT_AMBULATORY_CARE_PROVIDER_SITE_OTHER): Payer: 59 | Admitting: Family Medicine

## 2017-12-26 ENCOUNTER — Encounter: Payer: Self-pay | Admitting: Family Medicine

## 2017-12-26 VITALS — BP 132/89 | HR 82 | Wt 206.0 lb

## 2017-12-26 DIAGNOSIS — Z124 Encounter for screening for malignant neoplasm of cervix: Secondary | ICD-10-CM

## 2017-12-26 DIAGNOSIS — Z3490 Encounter for supervision of normal pregnancy, unspecified, unspecified trimester: Secondary | ICD-10-CM

## 2017-12-26 DIAGNOSIS — O09529 Supervision of elderly multigravida, unspecified trimester: Secondary | ICD-10-CM

## 2017-12-26 DIAGNOSIS — Z1151 Encounter for screening for human papillomavirus (HPV): Secondary | ICD-10-CM | POA: Diagnosis not present

## 2017-12-26 DIAGNOSIS — Z3481 Encounter for supervision of other normal pregnancy, first trimester: Secondary | ICD-10-CM

## 2017-12-26 DIAGNOSIS — Z3480 Encounter for supervision of other normal pregnancy, unspecified trimester: Secondary | ICD-10-CM

## 2017-12-26 DIAGNOSIS — O26891 Other specified pregnancy related conditions, first trimester: Secondary | ICD-10-CM

## 2017-12-26 DIAGNOSIS — Z113 Encounter for screening for infections with a predominantly sexual mode of transmission: Secondary | ICD-10-CM | POA: Diagnosis not present

## 2017-12-26 DIAGNOSIS — Z6791 Unspecified blood type, Rh negative: Secondary | ICD-10-CM

## 2017-12-26 NOTE — Progress Notes (Signed)
Subjective:  Christine Bryant is a [id number] [redacted]w[redacted]d being seen today for her first obstetrical visit.  Her obstetrical history is significant for macrosomia, Rh negative blood type. Patient does intend to breast feed. Pregnancy history fully reviewed. This was a planned pregnancy. FOB is her husband, Dominica Severin.  Patient reports no complaints.  BP 132/89   Pulse 82   Wt 206 lb (93.4 kg)   LMP 10/25/2017   BMI 29.56 kg/m   HISTORY: OB History  Gravida Para Term Preterm AB Living  4 3 3     3   SAB TAB Ectopic Multiple Live Births        0 3    # Outcome Date GA Lbr Len/2nd Weight Sex Delivery Anes PTL Lv  4 Current           3 Term 10/07/16 [redacted]w[redacted]d 26:45 / 00:34 10 lb 5.1 oz (4.68 kg) M Vag-Spont None  LIV  2 Term 12/19/14 [redacted]w[redacted]d  10 lb 1 oz (4.564 kg) F Vag-Spont  N   1 Term 07/12/13 [redacted]w[redacted]d  8 lb 15 oz (4.054 kg) F Vag-Spont  Y     Past Medical History:  Diagnosis Date  . Anemia     Past Surgical History:  Procedure Laterality Date  . CAROTID BODY TUMOR EXCISION     benign tumor removed    Family History  Problem Relation Age of Onset  . Thyroid disease Mother   . Heart attack Father 90  . Healthy Daughter   . Healthy Son   . Diabetes Maternal Grandmother   . Colon cancer Maternal Grandmother 61  . Diabetes Maternal Grandfather   . Heart attack Paternal Grandfather 47  . Diabetes Paternal Grandfather   . Healthy Daughter      Exam    Uterus:     Pelvic Exam:    Perineum: No Hemorrhoids, Normal Perineum   Vulva: normal, Bartholin's, Urethra, Skene's normal   Vagina:  normal mucosa   Cervix: multiparous appearance, no bleeding following Pap, no cervical motion tenderness and no lesions   Adnexa: normal adnexa and no mass, fullness, tenderness   Bony Pelvis: gynecoid  System: Breast:  Patient declined exam   Skin: normal coloration and turgor. Has mild erythema with flaking skin around left upper eyelid and left cheek    Neurologic: gait normal; reflexes normal and  symmetric   Extremities: normal strength, tone, and muscle mass, no deformities, no erythema, induration, or nodules, no evidence of joint effusion   HEENT PERRLA and extra ocular movement intact   Mouth/Teeth mucous membranes moist, pharynx normal without lesions   Neck supple and no masses   Cardiovascular: regular rate and rhythm, no murmurs or gallops   Respiratory:  appears well, vitals normal, no respiratory distress, acyanotic, normal RR, ear and throat exam is normal, neck free of mass or lymphadenopathy, chest clear, no wheezing, crepitations, rhonchi, normal symmetric air entry   Abdomen: soft, non-tender; bowel sounds normal; no masses,  no organomegaly   Urinary: urethral meatus normal      Assessment:    Pregnancy: [id number] Patient Active Problem List   Diagnosis Date Noted  . TMJ click 21/19/4174  . Hay fever 08/05/2017  . Supervision of other normal pregnancy, antepartum 02/29/2016  . 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 Lesion of pinna, left 08/31/2015  . Vitamin D insufficiency 07/27/2011      Plan:   1. Prenatal care, antepartum  - Urine Culture - Cytology - PAP  2.  Supervision of other normal pregnancy, antepartum Discussed prenatal labs - they would like to get these next appointment. Offered Panorama testing - this was declined. Offered CF testing - they will think about this.  3. Rh negative status during pregnancy in first trimester Rhogam recommended at 28 weeks  4. AMA (advanced maternal age) multigravida 50+, unspecified trimester Declined trisomy testing     Problem list reviewed and updated. 75% of 60 min visit spent on counseling and coordination of care.     Truett Mainland 12/26/2017

## 2017-12-28 LAB — URINE CULTURE

## 2017-12-29 LAB — CYTOLOGY - PAP
CHLAMYDIA, DNA PROBE: NEGATIVE
Diagnosis: NEGATIVE
HPV: NOT DETECTED
Neisseria Gonorrhea: NEGATIVE

## 2018-01-23 ENCOUNTER — Ambulatory Visit (INDEPENDENT_AMBULATORY_CARE_PROVIDER_SITE_OTHER): Payer: 59 | Admitting: Family Medicine

## 2018-01-23 VITALS — BP 104/68 | HR 81 | Wt 205.1 lb

## 2018-01-23 DIAGNOSIS — Z3482 Encounter for supervision of other normal pregnancy, second trimester: Secondary | ICD-10-CM

## 2018-01-23 DIAGNOSIS — Z3480 Encounter for supervision of other normal pregnancy, unspecified trimester: Secondary | ICD-10-CM

## 2018-01-23 NOTE — Progress Notes (Signed)
   PRENATAL VISIT NOTE  Subjective:  Christine Bryant is a 37 y.o. G4P3003 at [redacted]w[redacted]d being seen today for ongoing prenatal care.  She is currently monitored for the following issues for this low-risk pregnancy and has Supervision of other normal pregnancy, antepartum; Vitamin D insufficiency; TMJ click; Pleomorphic adenoma of parotid gland; Lesion of pinna, left; and Hay fever on their problem list.  Patient reports no complaints.  Contractions: Not present. Vag. Bleeding: None.  Movement: Present. Denies leaking of fluid.   The following portions of the patient's history were reviewed and updated as appropriate: allergies, current medications, past family history, past medical history, past social history, past surgical history and problem list. Problem list updated.  Objective:   Vitals:   01/23/18 1007  BP: 104/68  Pulse: 81  Weight: 205 lb 1.3 oz (93 kg)    Fetal Status: Fetal Heart Rate (bpm): 160   Movement: Present     General:  Alert, oriented and cooperative. Patient is in no acute distress.  Skin: Skin is warm and dry. No rash noted.   Cardiovascular: Normal heart rate noted  Respiratory: Normal respiratory effort, no problems with respiration noted  Abdomen: Soft, gravid, appropriate for gestational age.  Pain/Pressure: Present     Pelvic: Cervical exam deferred        Extremities: Normal range of motion.  Edema: None  Mental Status: Normal mood and affect. Normal behavior. Normal judgment and thought content.   Assessment and Plan:  Pregnancy: G4P3003 at [redacted]w[redacted]d  1. Supervision of other normal pregnancy, antepartum FHT and FH normal. Patient moving to [place] area. Is looking for [facility]  PNL today. Declines CF testing. - Obstetric Panel, Including HIV  Preterm labor symptoms and general obstetric precautions including but not limited to vaginal bleeding, contractions, leaking of fluid and fetal movement were reviewed in detail with the patient. Please refer to After  Visit Summary for other counseling recommendations.  No follow-ups on file.  No future appointments.  Truett Mainland, DO

## 2018-01-27 LAB — OBSTETRIC PANEL, INCLUDING HIV
HEP B S AG: NEGATIVE
HIV Screen 4th Generation wRfx: NONREACTIVE
RPR Ser Ql: NONREACTIVE
Rubella Antibodies, IGG: 3.57 index (ref >0.99)

## 2018-04-13 ENCOUNTER — Encounter: Payer: Self-pay | Admitting: Family Medicine

## 2018-10-16 ENCOUNTER — Encounter (HOSPITAL_COMMUNITY): Payer: Self-pay
